# Patient Record
Sex: Male | Born: 1945 | Race: White | Hispanic: No | Marital: Married
Health system: Southern US, Community
[De-identification: ages and names within clinical notes are randomized; demographics above are authoritative.]

## PROBLEM LIST (undated history)

## (undated) DIAGNOSIS — J431 Panlobular emphysema: Secondary | ICD-10-CM

## (undated) DIAGNOSIS — Z720 Tobacco use: Secondary | ICD-10-CM

## (undated) DIAGNOSIS — J449 Chronic obstructive pulmonary disease, unspecified: Secondary | ICD-10-CM

## (undated) DIAGNOSIS — J439 Emphysema, unspecified: Secondary | ICD-10-CM

## (undated) DIAGNOSIS — C801 Malignant (primary) neoplasm, unspecified: Secondary | ICD-10-CM

## (undated) DIAGNOSIS — G4733 Obstructive sleep apnea (adult) (pediatric): Secondary | ICD-10-CM

## (undated) DIAGNOSIS — I739 Peripheral vascular disease, unspecified: Secondary | ICD-10-CM

## (undated) DIAGNOSIS — Z85820 Personal history of malignant melanoma of skin: Secondary | ICD-10-CM

## (undated) HISTORY — DX: Chronic obstructive pulmonary disease, unspecified: J44.9

## (undated) HISTORY — DX: Personal history of malignant melanoma of skin: Z85.820

## (undated) HISTORY — DX: Panlobular emphysema: J43.1

## (undated) HISTORY — DX: Emphysema, unspecified: J43.9

## (undated) HISTORY — DX: Obstructive sleep apnea (adult) (pediatric): G47.33

## (undated) HISTORY — DX: Tobacco use: Z72.0

## (undated) HISTORY — PX: HERNIA REPAIR: SHX51

---

## 2008-04-15 HISTORY — PX: MELANOMA EXCISION: SHX5266

## 2015-02-05 DIAGNOSIS — J449 Chronic obstructive pulmonary disease, unspecified: Secondary | ICD-10-CM | POA: Diagnosis not present

## 2015-03-13 DIAGNOSIS — Z125 Encounter for screening for malignant neoplasm of prostate: Secondary | ICD-10-CM | POA: Diagnosis not present

## 2015-03-13 DIAGNOSIS — Z79899 Other long term (current) drug therapy: Secondary | ICD-10-CM | POA: Diagnosis not present

## 2015-04-07 DIAGNOSIS — Z1212 Encounter for screening for malignant neoplasm of rectum: Secondary | ICD-10-CM | POA: Diagnosis not present

## 2015-04-18 DIAGNOSIS — L918 Other hypertrophic disorders of the skin: Secondary | ICD-10-CM | POA: Diagnosis not present

## 2015-04-18 DIAGNOSIS — L821 Other seborrheic keratosis: Secondary | ICD-10-CM | POA: Diagnosis not present

## 2015-04-18 DIAGNOSIS — D485 Neoplasm of uncertain behavior of skin: Secondary | ICD-10-CM | POA: Diagnosis not present

## 2016-03-14 DIAGNOSIS — N400 Enlarged prostate without lower urinary tract symptoms: Secondary | ICD-10-CM | POA: Diagnosis not present

## 2016-03-14 DIAGNOSIS — J439 Emphysema, unspecified: Secondary | ICD-10-CM | POA: Diagnosis not present

## 2016-03-14 DIAGNOSIS — F17200 Nicotine dependence, unspecified, uncomplicated: Secondary | ICD-10-CM | POA: Diagnosis not present

## 2016-03-14 DIAGNOSIS — Z125 Encounter for screening for malignant neoplasm of prostate: Secondary | ICD-10-CM | POA: Diagnosis not present

## 2016-03-14 DIAGNOSIS — Z1389 Encounter for screening for other disorder: Secondary | ICD-10-CM | POA: Diagnosis not present

## 2016-03-14 DIAGNOSIS — Z0000 Encounter for general adult medical examination without abnormal findings: Secondary | ICD-10-CM | POA: Diagnosis not present

## 2016-03-14 DIAGNOSIS — Z9181 History of falling: Secondary | ICD-10-CM | POA: Diagnosis not present

## 2016-03-14 DIAGNOSIS — D034 Melanoma in situ of scalp and neck: Secondary | ICD-10-CM | POA: Diagnosis not present

## 2016-03-14 DIAGNOSIS — Z79899 Other long term (current) drug therapy: Secondary | ICD-10-CM | POA: Diagnosis not present

## 2016-03-27 DIAGNOSIS — Z1212 Encounter for screening for malignant neoplasm of rectum: Secondary | ICD-10-CM | POA: Diagnosis not present

## 2016-04-18 ENCOUNTER — Other Ambulatory Visit: Payer: Self-pay | Admitting: Internal Medicine

## 2016-04-18 DIAGNOSIS — Z720 Tobacco use: Secondary | ICD-10-CM | POA: Insufficient documentation

## 2016-04-18 DIAGNOSIS — Z0000 Encounter for general adult medical examination without abnormal findings: Secondary | ICD-10-CM | POA: Diagnosis not present

## 2016-04-18 DIAGNOSIS — Z85820 Personal history of malignant melanoma of skin: Secondary | ICD-10-CM | POA: Insufficient documentation

## 2016-04-18 DIAGNOSIS — Z125 Encounter for screening for malignant neoplasm of prostate: Secondary | ICD-10-CM | POA: Diagnosis not present

## 2016-04-18 DIAGNOSIS — R0609 Other forms of dyspnea: Secondary | ICD-10-CM | POA: Diagnosis not present

## 2016-04-18 DIAGNOSIS — Z111 Encounter for screening for respiratory tuberculosis: Secondary | ICD-10-CM | POA: Diagnosis not present

## 2016-04-18 DIAGNOSIS — J431 Panlobular emphysema: Secondary | ICD-10-CM | POA: Insufficient documentation

## 2016-04-18 DIAGNOSIS — G4733 Obstructive sleep apnea (adult) (pediatric): Secondary | ICD-10-CM | POA: Insufficient documentation

## 2016-04-19 ENCOUNTER — Ambulatory Visit
Admission: RE | Admit: 2016-04-19 | Discharge: 2016-04-19 | Disposition: A | Discharge status: Home / Self Care | Payer: Medicare HMO | Source: Ambulatory Visit | Attending: Internal Medicine | Admitting: Internal Medicine

## 2016-04-19 DIAGNOSIS — J439 Emphysema, unspecified: Secondary | ICD-10-CM | POA: Insufficient documentation

## 2016-04-19 DIAGNOSIS — R0609 Other forms of dyspnea: Secondary | ICD-10-CM | POA: Insufficient documentation

## 2016-04-19 HISTORY — DX: Malignant (primary) neoplasm, unspecified: C80.1

## 2016-04-19 LAB — POCT I-STAT CREATININE: Creatinine, Ser: 0.8 mg/dL (ref 0.61–1.24)

## 2016-04-19 MED ORDER — IOPAMIDOL (ISOVUE-300) INJECTION 61%
75.0000 mL | Freq: Once | INTRAVENOUS | Status: AC | PRN
  Administered 2016-04-19: 75 mL via INTRAVENOUS

## 2016-04-22 ENCOUNTER — Encounter: Payer: Self-pay | Admitting: Cardiothoracic Surgery

## 2016-04-26 ENCOUNTER — Telehealth: Payer: Self-pay

## 2016-04-26 ENCOUNTER — Ambulatory Visit: Payer: Self-pay | Admitting: Cardiothoracic Surgery

## 2016-04-26 ENCOUNTER — Encounter: Payer: Self-pay | Admitting: Cardiothoracic Surgery

## 2016-04-26 ENCOUNTER — Ambulatory Visit (INDEPENDENT_AMBULATORY_CARE_PROVIDER_SITE_OTHER): Payer: Medicare HMO | Admitting: Cardiothoracic Surgery

## 2016-04-26 VITALS — BP 164/81 | HR 91 | Temp 98.4°F | Ht 73.0 in | Wt 155.0 lb

## 2016-04-26 DIAGNOSIS — R918 Other nonspecific abnormal finding of lung field: Secondary | ICD-10-CM

## 2016-04-26 NOTE — Telephone Encounter (Signed)
Patient's wife called in and states she is returning your phone call.

## 2016-04-26 NOTE — Progress Notes (Signed)
Patient ID: Derek Henderson, male   DOB: [birth year], 71 y.o.   MRN: [id number]  Chief Complaint  Patient presents with  . Other    Lung Mass    Referred By Dr. Emily Filbert Reason for Referral bilateral lower lobe infiltrates  HPI Location, Quality, Duration, Severity, Timing, Context, Modifying Factors, Associated Signs and Symptoms.  Derek Henderson is a 71 y.o. male.  He is a lifelong smoker having started at the age of 54. He smoked anywhere between 1-2 packs cigarettes a day. About a month ago he began experiencing a cough without any further symptoms. He denied any fevers or chills. He denied any chest pain. He denied any hemoptysis. He was seen by his primary care physician where chest x-ray was made which was abnormal and this prompted a CT scan the chest. CT scan the chest confirmed the presence of severe COPD with bilateral paramedian pulmonic infiltrates. There was no associated mediastinal adenopathy and the upper abdomen that was visualized was unremarkable. The patient was treated with antibiotics and prednisone and he states that he feels dramatically better. He specifically states that his cough is now almost completely gone. He has lost weight over the last several years but none within the last several months. He does continue to smoke at least 1-2 packs cigarettes per day.   Past Medical History:  Diagnosis Date  . Cancer ([place])   . COPD (chronic obstructive pulmonary disease) ([place])   . History of malignant melanoma   . Mild obstructive sleep apnea    can not afford CPAP machine  . Panlobular emphysema ([place])   . Tobacco abuse     Past Surgical History:  Procedure Laterality Date  . HERNIA REPAIR Left   . MELANOMA EXCISION Right 2010    Family History  Problem Relation Age of Onset  . Cancer Mother     lung and breast  . Tuberculosis Mother   . Cancer Father     throat  . Cancer Sister     stomach  . Cancer Brother     Brain   . Cancer Sister     lung   . Cancer Brother     lung    Social History Social History  Substance Use Topics  . Smoking status: Current Every Day Smoker    Packs/day: 1.00    Years: 45.00  . Smokeless tobacco: Never Used  . Alcohol use No    Allergies  Allergen Reactions  . Codeine Other (See Comments)    Current Outpatient Prescriptions  Medication Sig Dispense Refill  . cefUROXime (CEFTIN) 500 MG tablet Take 500 mg by mouth 2 (two) times daily with a meal.    . predniSONE (DELTASONE) 20 MG tablet Take 20 mg by mouth daily with breakfast.     No current facility-administered medications for this visit.       Review of Systems A complete review of systems was asked and was negative except for the following positive findingsWeight loss, cough, shortness of breath  Blood pressure (!) 164/81, pulse 91, temperature 98.4 F (36.9 C), temperature source Oral, height 6\' 1"  (1.854 m), weight 155 lb (70.3 kg).  Physical Exam CONSTITUTIONAL:  Pleasant, well-developed, well-nourished, and in no acute distress. EYES: Pupils equal and reactive to light, Sclera non-icteric EARS, NOSE, MOUTH AND THROAT:  The oropharynx was clear.  Dentition is good repair.  Oral mucosa pink and moist. LYMPH NODES:  Lymph nodes in the neck and axillae were normal RESPIRATORY:  Lungs were clear.  Normal respiratory effort without pathologic use of accessory muscles of respiration CARDIOVASCULAR: Heart was regular without murmurs.  There were no carotid bruits. GI: The abdomen was soft, nontender, and nondistended. There were no palpable masses. There was no hepatosplenomegaly. There were normal bowel sounds in all quadrants. GU:  Rectal deferred.   MUSCULOSKELETAL:  Normal muscle strength and tone.  No clubbing or cyanosis.   SKIN:  There were no pathologic skin lesions.  There were no nodules on palpation. NEUROLOGIC:  Sensation is normal.  Cranial nerves are grossly intact. PSYCH:  Oriented to person, place and time.  Mood and  affect are normal.  Data Reviewed CT scan and chest x-ray  I have personally reviewed the patient's imaging, laboratory findings and medical records.    Assessment    I have independently reviewed the CT scan. There are bilateral paramedial lung infiltrates that are probably most consistent with an inflammatory or infectious etiology.    Plan    I did recommend to him that he continue his antibiotics and prednisone. We'll bring him back again in 5 weeks' time for repeat CT of the chest. This will be 6 weeks since his original diagnostic study.       Nestor Lewandowsky, MD 04/26/2016, 3:13 PM

## 2016-04-26 NOTE — Patient Instructions (Signed)
We will call you with your CT Scan appointment and follow up with Dr. Genevive Bi.

## 2016-05-03 NOTE — Telephone Encounter (Signed)
I have called patient back and advised them that per Dr Genevive Bi' notes, he does want the patient to have a repeat CT scan done in 5 weeks. Patient understands.

## 2016-05-16 DIAGNOSIS — J431 Panlobular emphysema: Secondary | ICD-10-CM | POA: Diagnosis not present

## 2016-05-24 ENCOUNTER — Encounter: Payer: Self-pay | Admitting: Cardiothoracic Surgery

## 2016-05-24 ENCOUNTER — Ambulatory Visit
Admission: RE | Admit: 2016-05-24 | Discharge: 2016-05-24 | Disposition: A | Discharge status: Home / Self Care | Payer: Medicare HMO | Source: Ambulatory Visit | Attending: Cardiothoracic Surgery | Admitting: Cardiothoracic Surgery

## 2016-05-24 ENCOUNTER — Ambulatory Visit (INDEPENDENT_AMBULATORY_CARE_PROVIDER_SITE_OTHER): Payer: Medicare HMO | Admitting: Cardiothoracic Surgery

## 2016-05-24 VITALS — BP 132/73 | HR 55 | Temp 98.7°F | Wt 165.0 lb

## 2016-05-24 DIAGNOSIS — R918 Other nonspecific abnormal finding of lung field: Secondary | ICD-10-CM

## 2016-05-24 DIAGNOSIS — I700 Atherosclerosis of aorta: Secondary | ICD-10-CM | POA: Insufficient documentation

## 2016-05-24 DIAGNOSIS — I2510 Atherosclerotic heart disease of native coronary artery without angina pectoris: Secondary | ICD-10-CM | POA: Diagnosis not present

## 2016-05-24 DIAGNOSIS — J432 Centrilobular emphysema: Secondary | ICD-10-CM | POA: Diagnosis not present

## 2016-05-24 NOTE — Patient Instructions (Signed)
I will contact you in 3 months with a CT Scan appointment and follow up with Dr. Genevive Bi.

## 2016-05-24 NOTE — Progress Notes (Signed)
  Patient ID: Derek Henderson, male   DOB: 1945-04-18, 71 y.o.   MRN: [id number]  HISTORY: This patient returns today in follow-up. He continues to smoke at least a pack of cigarettes per day. He states that he has a chronic cough which is nonproductive. He has had no fevers or chills. He does not complain of any shortness of breath and instead he is able to perform all his activities of daily living.   Vitals:   05/24/16 1243  BP: 132/73  Pulse: (!) 55  Temp: 98.7 F (37.1 C)     EXAM:    Resp: Lungs are clear bilaterally But distant .  No respiratory distress, normal effort. Heart:  Regular without murmurs Abd:  Abdomen is soft, non distended and non tender. No masses are palpable.  There is no rebound and no guarding.  Neurological: Alert and oriented to person, place, and time. Coordination normal.  Skin: Skin is warm and dry. No rash noted. No diaphoretic. No erythema. No pallor.  Psychiatric: Normal mood and affect. Normal behavior. Judgment and thought content normal.    ASSESSMENT: I have independently reviewed the patient's CT scans. The bibasilar infiltrates have improved. The dominant right upper lobe nodule unfortunately has gotten somewhat larger. We'll bring the patient back again in 3 months for repeat CT scan the chest for further follow-up.   PLAN:   Follow-up CT scan in 3 months.    Nestor Lewandowsky, MD

## 2016-08-13 ENCOUNTER — Telehealth: Payer: Self-pay

## 2016-08-13 DIAGNOSIS — R918 Other nonspecific abnormal finding of lung field: Secondary | ICD-10-CM

## 2016-08-13 NOTE — Telephone Encounter (Signed)
-----   Message from [place], [place] sent at 05/24/2016  1:14 PM EST ----- Regarding: Appointments Call patient and schedule a CT Scan w/o contrast and then a follow up appointment with Dr. Genevive Bi.

## 2016-08-13 NOTE — Telephone Encounter (Signed)
Called patient to remind him that he needs to have a recheck on his CT Scan and then a follow up appointment with Dr. Genevive Bi. I also told him that I would mail him the information so he could have it on paper. Patient understood and had no further questions.

## 2016-08-23 ENCOUNTER — Telehealth: Payer: Self-pay

## 2016-08-23 NOTE — Telephone Encounter (Signed)
Received fax for request of clinical notes from [facility]. Katina Degree completed and clinical notes faxed to [phone] at this time with confirmation.

## 2016-08-30 ENCOUNTER — Ambulatory Visit
Admission: RE | Admit: 2016-08-30 | Discharge: 2016-08-30 | Disposition: A | Discharge status: Home / Self Care | Payer: Medicare HMO | Source: Ambulatory Visit | Attending: Cardiothoracic Surgery | Admitting: Cardiothoracic Surgery

## 2016-08-30 ENCOUNTER — Ambulatory Visit: Admission: RE | Admit: 2016-08-30 | Payer: Medicare HMO | Source: Ambulatory Visit

## 2016-08-30 ENCOUNTER — Encounter: Payer: Self-pay | Admitting: Cardiothoracic Surgery

## 2016-08-30 ENCOUNTER — Ambulatory Visit (INDEPENDENT_AMBULATORY_CARE_PROVIDER_SITE_OTHER): Payer: Medicare HMO | Admitting: Cardiothoracic Surgery

## 2016-08-30 VITALS — BP 151/76 | HR 55 | Temp 98.1°F | Resp 18 | Ht 73.0 in | Wt 168.2 lb

## 2016-08-30 DIAGNOSIS — I2510 Atherosclerotic heart disease of native coronary artery without angina pectoris: Secondary | ICD-10-CM | POA: Insufficient documentation

## 2016-08-30 DIAGNOSIS — R918 Other nonspecific abnormal finding of lung field: Secondary | ICD-10-CM

## 2016-08-30 DIAGNOSIS — R911 Solitary pulmonary nodule: Secondary | ICD-10-CM

## 2016-08-30 DIAGNOSIS — J432 Centrilobular emphysema: Secondary | ICD-10-CM | POA: Insufficient documentation

## 2016-08-30 DIAGNOSIS — I700 Atherosclerosis of aorta: Secondary | ICD-10-CM | POA: Diagnosis not present

## 2016-08-30 NOTE — Patient Instructions (Signed)
We will call you in a year to schedule your CT Scan and follow up appointment with Dr. Genevive Bi.  If you are wanting to quit smoking, we could set you up with the nurse navigator.

## 2016-08-30 NOTE — Progress Notes (Signed)
  Patient ID: Derek Henderson, male   DOB: 1945/12/14, 71 y.o.   MRN: [id number]  HISTORY: He returns today in follow-up. He does continue to smoke at least a half pack cigarettes per day. He does complain of some shortness of breath with exertion. He has a cough which is nonproductive.   Vitals:   08/30/16 0851  BP: (!) 151/76  Pulse: (!) 55  Resp: 18  Temp: 98.1 F (36.7 C)     EXAM:    Resp: Lungs are clear bilaterally.  No respiratory distress, normal effort. Heart:  Regular without murmurs Abd:  Abdomen is soft, non distended and non tender. No masses are palpable.  There is no rebound and no guarding.  Neurological: Alert and oriented to person, place, and time. Coordination normal.  Skin: Skin is warm and dry. No rash noted. No diaphoretic. No erythema. No pallor.  Psychiatric: Normal mood and affect. Normal behavior. Judgment and thought content normal.    ASSESSMENT: He did have a chest CT scan made today. Of independently reviewed that. I compared it to his prior scans. All the nodules appear either resolved or improving. Therefore I suspect that these are inflammatory or infectious in etiology.   PLAN:   We would like to bring him back again in 12 months time with a repeat chest CT. We again encouraged him to try to stop smoking. I offered him our smoking cessation classes but he declined. I will see him back again in 12 months with a noncontrast chest CT.    Nestor Lewandowsky, MD

## 2016-11-20 DIAGNOSIS — Z79899 Other long term (current) drug therapy: Secondary | ICD-10-CM | POA: Diagnosis not present

## 2016-11-20 DIAGNOSIS — Z125 Encounter for screening for malignant neoplasm of prostate: Secondary | ICD-10-CM | POA: Diagnosis not present

## 2016-11-20 DIAGNOSIS — J431 Panlobular emphysema: Secondary | ICD-10-CM | POA: Diagnosis not present

## 2016-11-20 DIAGNOSIS — R918 Other nonspecific abnormal finding of lung field: Secondary | ICD-10-CM | POA: Diagnosis not present

## 2016-11-20 DIAGNOSIS — Z0000 Encounter for general adult medical examination without abnormal findings: Secondary | ICD-10-CM | POA: Diagnosis not present

## 2017-02-04 DIAGNOSIS — I998 Other disorder of circulatory system: Secondary | ICD-10-CM | POA: Diagnosis not present

## 2017-02-05 ENCOUNTER — Other Ambulatory Visit (INDEPENDENT_AMBULATORY_CARE_PROVIDER_SITE_OTHER): Payer: Medicare HMO

## 2017-02-05 ENCOUNTER — Other Ambulatory Visit (INDEPENDENT_AMBULATORY_CARE_PROVIDER_SITE_OTHER): Payer: Self-pay | Admitting: Vascular Surgery

## 2017-02-05 ENCOUNTER — Encounter (INDEPENDENT_AMBULATORY_CARE_PROVIDER_SITE_OTHER): Payer: Self-pay | Admitting: Vascular Surgery

## 2017-02-05 ENCOUNTER — Ambulatory Visit (INDEPENDENT_AMBULATORY_CARE_PROVIDER_SITE_OTHER): Payer: Medicare HMO | Admitting: Vascular Surgery

## 2017-02-05 VITALS — BP 155/76 | HR 61 | Resp 16 | Ht 73.0 in | Wt 161.0 lb

## 2017-02-05 DIAGNOSIS — R0989 Other specified symptoms and signs involving the circulatory and respiratory systems: Secondary | ICD-10-CM

## 2017-02-05 DIAGNOSIS — Z720 Tobacco use: Secondary | ICD-10-CM | POA: Diagnosis not present

## 2017-02-05 DIAGNOSIS — I998 Other disorder of circulatory system: Secondary | ICD-10-CM | POA: Diagnosis not present

## 2017-02-05 DIAGNOSIS — I739 Peripheral vascular disease, unspecified: Secondary | ICD-10-CM | POA: Diagnosis not present

## 2017-02-05 NOTE — Progress Notes (Signed)
Subjective:    Patient ID: Derek Henderson, male    DOB: Apr 17, 1945, 71 y.o.   MRN: [id number] Chief Complaint  Patient presents with  . Follow-up    ischemic toe decrease pedal pulses   Presents as a new patient referred by Dr. Sabra Heck for evaluation of peripheral artery disease. During a recent physical examination, Dr. Sabra Heck was unable to palpate pedal pulses to the left lower extremity. The patient is endorses a long-standing history of progressively worsening discomfort to the left lower extremity. He has been experiencing what sounds like calf claudication for months. Over the last week, the patient has started to experience left foot pain. Is discomfort is worsened with activity. He denies any rest pain or ulcer to the lower extremity. The patient underwent a bilateral ABI which was notable for triphasic tibials and no evidence of significant right lower extremity arterial disease. Monophasic left tibials with severe left lower extremity arterial disease. Normal toe brachial indices on the right with abnormal on the left. There is no previous to compare to. Patient denies any fever, nausea or vomiting.   Review of Systems  Constitutional: Negative.   HENT: Negative.   Eyes: Negative.   Respiratory: Negative.   Cardiovascular:       Left lower extremity pain  Gastrointestinal: Negative.   Endocrine: Negative.   Genitourinary: Negative.   Musculoskeletal: Negative.   Skin: Negative.   Allergic/Immunologic: Negative.   Neurological: Negative.   Hematological: Negative.   Psychiatric/Behavioral: Negative.       Objective:   Physical Exam  Constitutional: He is oriented to person, place, and time. He appears well-developed and well-nourished. No distress.  HENT:  Head: Normocephalic and atraumatic.  Eyes: Pupils are equal, round, and reactive to light. Conjunctivae are normal.  Neck: Normal range of motion.  Cardiovascular: Normal rate, regular rhythm, normal heart sounds  and intact distal pulses.   Pulses:      Radial pulses are 2+ on the right side, and 2+ on the left side.       Dorsalis pedis pulses are 2+ on the right side.       Posterior tibial pulses are 2+ on the right side.  Unable to palpate left lower extremity pedal pulses.  Pulmonary/Chest: Effort normal.  Musculoskeletal: Normal range of motion. He exhibits no edema.  Neurological: He is alert and oriented to person, place, and time.  Skin: Skin is warm and dry. He is not diaphoretic.  Skin is intact  Psychiatric: He has a normal mood and affect. His behavior is normal. Judgment and thought content normal.   BP (!) 155/76 (BP Location: Left Arm)   Pulse 61   Resp 16   Ht 6\' 1"  (1.854 m)   Wt 161 lb (73 kg)   BMI 21.24 kg/m   Past Medical History:  Diagnosis Date  . Cancer ([place])    melanoma  . COPD (chronic obstructive pulmonary disease) ([place])   . Emphysema lung ([place])   . History of malignant melanoma   . Mild obstructive sleep apnea    can not afford CPAP machine  . Panlobular emphysema ([place])   . Tobacco abuse    Social History   Social History  . Marital status: Single    Spouse name: N/A  . Number of children: N/A  . Years of education: N/A   Occupational History  . Not on file.   Social History Main Topics  . Smoking status: Current Every Day Smoker  Packs/day: 1.00    Years: 45.00  . Smokeless tobacco: Current User    Types: Chew  . Alcohol use No  . Drug use: No  . Sexual activity: Not on file   Other Topics Concern  . Not on file   Social History Narrative  . No narrative on file   Past Surgical History:  Procedure Laterality Date  . HERNIA REPAIR Left   . MELANOMA EXCISION Right 2010   Family History  Problem Relation Age of Onset  . Cancer Mother        lung and breast  . Tuberculosis Mother   . Cancer Father        throat  . Cancer Sister        stomach  . Cancer Brother        Brain   . Cancer Sister        lung  . Cancer Brother         lung   Allergies  Allergen Reactions  . Codeine Other (See Comments)    Headache       Assessment & Plan:  Presents as a new patient referred by Dr. Sabra Heck for evaluation of peripheral artery disease. During a recent physical examination, Dr. Sabra Heck was unable to palpate pedal pulses to the left lower extremity. The patient is endorses a long-standing history of progressively worsening discomfort to the left lower extremity. He has been experiencing what sounds like calf claudication for months. Over the last week, the patient has started to experience left foot pain. Is discomfort is worsened with activity. He denies any rest pain or ulcer to the lower extremity. The patient underwent a bilateral ABI which was notable for triphasic tibials and no evidence of significant right lower extremity arterial disease. Monophasic left tibials with severe left lower extremity arterial disease. Normal toe brachial indices on the right with abnormal on the left. There is no previous to compare to. Patient denies any fever, nausea or vomiting.  1. Peripheral artery disease ([place]) - New Patient with progressively worsening pain to the left lower extremity. Unable to palpate pedal pulses to the left lower extremity on exam ABI with severe left lower extremity arterial occlusive disease. Recommend left lower extremity angiogram with possible intervention. Procedure, risks and benefits explained to the patient All questions answered Patient wishes to proceed  2. Tobacco abuse - Stable We had a discussion for approximately 10 minutes regarding the absolute need for smoking cessation due to the deleterious nature of tobacco on the vascular system. We discussed the tobacco use would diminish patency of any intervention, and likely significantly worsen progressio of disease. We discussed multiple agents for quitting including replacement therapy or medications to reduce cravings such as Chantix. The patient  voices their understanding of the importance of smoking cessation.  Current Outpatient Prescriptions on File Prior to Visit  Medication Sig Dispense Refill  . budesonide-formoterol (SYMBICORT) 160-4.5 MCG/ACT inhaler Inhale 2 puffs into the lungs 2 (two) times daily.     No current facility-administered medications on file prior to visit.    There are no Patient Instructions on file for this visit. No Follow-up on file.  Emberlyn Burlison A Arnelle Nale, PA-C

## 2017-02-07 ENCOUNTER — Other Ambulatory Visit (INDEPENDENT_AMBULATORY_CARE_PROVIDER_SITE_OTHER): Payer: Self-pay | Admitting: Vascular Surgery

## 2017-02-10 ENCOUNTER — Encounter
Admission: RE | Admit: 2017-02-10 | Discharge: 2017-02-10 | Disposition: A | Discharge status: Home / Self Care | Payer: Medicare HMO | Source: Ambulatory Visit | Attending: Vascular Surgery | Admitting: Vascular Surgery

## 2017-02-10 DIAGNOSIS — Z9889 Other specified postprocedural states: Secondary | ICD-10-CM | POA: Diagnosis not present

## 2017-02-10 DIAGNOSIS — Z808 Family history of malignant neoplasm of other organs or systems: Secondary | ICD-10-CM | POA: Diagnosis not present

## 2017-02-10 DIAGNOSIS — Z885 Allergy status to narcotic agent status: Secondary | ICD-10-CM | POA: Diagnosis not present

## 2017-02-10 DIAGNOSIS — Z85820 Personal history of malignant melanoma of skin: Secondary | ICD-10-CM | POA: Diagnosis not present

## 2017-02-10 DIAGNOSIS — Z803 Family history of malignant neoplasm of breast: Secondary | ICD-10-CM | POA: Diagnosis not present

## 2017-02-10 DIAGNOSIS — Z800 Family history of malignant neoplasm of digestive organs: Secondary | ICD-10-CM | POA: Diagnosis not present

## 2017-02-10 DIAGNOSIS — J449 Chronic obstructive pulmonary disease, unspecified: Secondary | ICD-10-CM | POA: Diagnosis not present

## 2017-02-10 DIAGNOSIS — F17210 Nicotine dependence, cigarettes, uncomplicated: Secondary | ICD-10-CM | POA: Diagnosis not present

## 2017-02-10 DIAGNOSIS — L97509 Non-pressure chronic ulcer of other part of unspecified foot with unspecified severity: Secondary | ICD-10-CM | POA: Diagnosis not present

## 2017-02-10 DIAGNOSIS — G4733 Obstructive sleep apnea (adult) (pediatric): Secondary | ICD-10-CM | POA: Diagnosis not present

## 2017-02-10 DIAGNOSIS — I7025 Atherosclerosis of native arteries of other extremities with ulceration: Secondary | ICD-10-CM | POA: Diagnosis not present

## 2017-02-10 DIAGNOSIS — Z801 Family history of malignant neoplasm of trachea, bronchus and lung: Secondary | ICD-10-CM | POA: Diagnosis not present

## 2017-02-10 HISTORY — DX: Peripheral vascular disease, unspecified: I73.9

## 2017-02-10 LAB — CREATININE, SERUM
Creatinine, Ser: 0.96 mg/dL (ref 0.61–1.24)
GFR calc Af Amer: >60 mL/min (ref >60)
GFR calc non Af Amer: >60 mL/min (ref >60)

## 2017-02-10 LAB — BUN: BUN: 13 mg/dL (ref 6–20)

## 2017-02-10 MED ORDER — CEFAZOLIN SODIUM-DEXTROSE 2-4 GM/100ML-% IV SOLN
2.0000 g | Freq: Once | INTRAVENOUS | Status: AC
  Administered 2017-02-11: 2 g via INTRAVENOUS

## 2017-02-11 ENCOUNTER — Encounter: Payer: Self-pay | Admitting: *Deleted

## 2017-02-11 ENCOUNTER — Encounter
Admission: RE | Disposition: A | Discharge status: Home / Self Care | Payer: Self-pay | Source: Ambulatory Visit | Attending: Vascular Surgery

## 2017-02-11 ENCOUNTER — Ambulatory Visit
Admission: RE | Admit: 2017-02-11 | Discharge: 2017-02-11 | Disposition: A | Discharge status: Home / Self Care | Payer: Medicare HMO | Source: Ambulatory Visit | Attending: Vascular Surgery | Admitting: Vascular Surgery

## 2017-02-11 DIAGNOSIS — G4733 Obstructive sleep apnea (adult) (pediatric): Secondary | ICD-10-CM | POA: Insufficient documentation

## 2017-02-11 DIAGNOSIS — J449 Chronic obstructive pulmonary disease, unspecified: Secondary | ICD-10-CM | POA: Insufficient documentation

## 2017-02-11 DIAGNOSIS — I70248 Atherosclerosis of native arteries of left leg with ulceration of other part of lower left leg: Secondary | ICD-10-CM | POA: Diagnosis not present

## 2017-02-11 DIAGNOSIS — I7025 Atherosclerosis of native arteries of other extremities with ulceration: Secondary | ICD-10-CM | POA: Diagnosis not present

## 2017-02-11 DIAGNOSIS — Z800 Family history of malignant neoplasm of digestive organs: Secondary | ICD-10-CM | POA: Insufficient documentation

## 2017-02-11 DIAGNOSIS — L97509 Non-pressure chronic ulcer of other part of unspecified foot with unspecified severity: Secondary | ICD-10-CM | POA: Diagnosis not present

## 2017-02-11 DIAGNOSIS — Z85820 Personal history of malignant melanoma of skin: Secondary | ICD-10-CM | POA: Diagnosis not present

## 2017-02-11 DIAGNOSIS — Z801 Family history of malignant neoplasm of trachea, bronchus and lung: Secondary | ICD-10-CM | POA: Diagnosis not present

## 2017-02-11 DIAGNOSIS — Z885 Allergy status to narcotic agent status: Secondary | ICD-10-CM | POA: Diagnosis not present

## 2017-02-11 DIAGNOSIS — Z9889 Other specified postprocedural states: Secondary | ICD-10-CM | POA: Diagnosis not present

## 2017-02-11 DIAGNOSIS — Z808 Family history of malignant neoplasm of other organs or systems: Secondary | ICD-10-CM | POA: Insufficient documentation

## 2017-02-11 DIAGNOSIS — Z803 Family history of malignant neoplasm of breast: Secondary | ICD-10-CM | POA: Insufficient documentation

## 2017-02-11 DIAGNOSIS — F17210 Nicotine dependence, cigarettes, uncomplicated: Secondary | ICD-10-CM | POA: Insufficient documentation

## 2017-02-11 HISTORY — PX: LOWER EXTREMITY INTERVENTION: CATH118252

## 2017-02-11 HISTORY — PX: LOWER EXTREMITY ANGIOGRAPHY: CATH118251

## 2017-02-11 SURGERY — LOWER EXTREMITY ANGIOGRAPHY
Anesthesia: Moderate Sedation | Laterality: Left

## 2017-02-11 MED ORDER — ONDANSETRON HCL 4 MG/2ML IJ SOLN: 4.0000 mg | Freq: Four times a day (QID) | INTRAMUSCULAR | Status: DC | PRN

## 2017-02-11 MED ORDER — SODIUM CHLORIDE 0.9% FLUSH: 3.0000 mL | INTRAVENOUS | Status: DC | PRN

## 2017-02-11 MED ORDER — MIDAZOLAM HCL 5 MG/5ML IJ SOLN
INTRAMUSCULAR | Status: AC
  Filled 2017-02-11: qty 5

## 2017-02-11 MED ORDER — LABETALOL HCL 5 MG/ML IV SOLN
10.0000 mg | INTRAVENOUS | Status: DC | PRN
  Administered 2017-02-11: 10 mg via INTRAVENOUS

## 2017-02-11 MED ORDER — OXYCODONE HCL 5 MG PO TABS: 5.0000 mg | ORAL_TABLET | ORAL | Status: DC | PRN

## 2017-02-11 MED ORDER — METHYLPREDNISOLONE SODIUM SUCC 125 MG IJ SOLR: 125.0000 mg | INTRAMUSCULAR | Status: DC | PRN

## 2017-02-11 MED ORDER — CLOPIDOGREL BISULFATE 75 MG PO TABS: 75.0000 mg | ORAL_TABLET | Freq: Every day | ORAL | 4 refills | Status: DC

## 2017-02-11 MED ORDER — ASPIRIN EC 81 MG PO TBEC: 81.0000 mg | DELAYED_RELEASE_TABLET | Freq: Every day | ORAL | 2 refills | Status: AC

## 2017-02-11 MED ORDER — LABETALOL HCL 5 MG/ML IV SOLN
INTRAVENOUS | Status: AC
  Filled 2017-02-11: qty 4

## 2017-02-11 MED ORDER — CLOPIDOGREL BISULFATE 75 MG PO TABS
ORAL_TABLET | ORAL | Status: AC
  Filled 2017-02-11: qty 4

## 2017-02-11 MED ORDER — LIDOCAINE HCL (PF) 1 % IJ SOLN
INTRAMUSCULAR | Status: AC
  Filled 2017-02-11: qty 30

## 2017-02-11 MED ORDER — HEPARIN SODIUM (PORCINE) 1000 UNIT/ML IJ SOLN
INTRAMUSCULAR | Status: DC | PRN
  Administered 2017-02-11: 5000 [IU] via INTRAVENOUS

## 2017-02-11 MED ORDER — CLOPIDOGREL BISULFATE 75 MG PO TABS
300.0000 mg | ORAL_TABLET | Freq: Once | ORAL | Status: AC
  Administered 2017-02-11: 300 mg via ORAL

## 2017-02-11 MED ORDER — FENTANYL CITRATE (PF) 100 MCG/2ML IJ SOLN
INTRAMUSCULAR | Status: AC
Start: 2017-02-11 — End: ?
  Filled 2017-02-11: qty 2

## 2017-02-11 MED ORDER — SODIUM CHLORIDE 0.9 % IV SOLN
INTRAVENOUS | Status: DC
  Administered 2017-02-11: 07:00:00 via INTRAVENOUS

## 2017-02-11 MED ORDER — HYDRALAZINE HCL 20 MG/ML IJ SOLN
INTRAMUSCULAR | Status: AC
  Filled 2017-02-11: qty 1

## 2017-02-11 MED ORDER — MORPHINE SULFATE (PF) 4 MG/ML IV SOLN: 2.0000 mg | INTRAVENOUS | Status: DC | PRN

## 2017-02-11 MED ORDER — IOPAMIDOL (ISOVUE-300) INJECTION 61%
INTRAVENOUS | Status: DC | PRN
  Administered 2017-02-11: 85 mL via INTRAVENOUS

## 2017-02-11 MED ORDER — CLOPIDOGREL BISULFATE 75 MG PO TABS: 75.0000 mg | ORAL_TABLET | Freq: Every day | ORAL | Status: DC

## 2017-02-11 MED ORDER — ASPIRIN 325 MG PO TABS: 325.0000 mg | ORAL_TABLET | Freq: Every day | ORAL | Status: DC

## 2017-02-11 MED ORDER — ASPIRIN 325 MG PO TABS: 325.0000 mg | ORAL_TABLET | Freq: Once | ORAL | Status: DC

## 2017-02-11 MED ORDER — MIDAZOLAM HCL 2 MG/2ML IJ SOLN
INTRAMUSCULAR | Status: DC | PRN
  Administered 2017-02-11: 2 mg via INTRAVENOUS
  Administered 2017-02-11: 1 mg via INTRAVENOUS

## 2017-02-11 MED ORDER — FENTANYL CITRATE (PF) 100 MCG/2ML IJ SOLN
INTRAMUSCULAR | Status: DC | PRN
  Administered 2017-02-11: 25 ug via INTRAVENOUS
  Administered 2017-02-11: 50 ug via INTRAVENOUS

## 2017-02-11 MED ORDER — HYDRALAZINE HCL 20 MG/ML IJ SOLN
5.0000 mg | INTRAMUSCULAR | Status: AC | PRN
  Administered 2017-02-11 (×2): 5 mg via INTRAVENOUS

## 2017-02-11 MED ORDER — SODIUM CHLORIDE 0.9% FLUSH: 3.0000 mL | Freq: Two times a day (BID) | INTRAVENOUS | Status: DC

## 2017-02-11 MED ORDER — ASPIRIN EC 325 MG PO TBEC
DELAYED_RELEASE_TABLET | ORAL | Status: AC
Start: 2017-02-11 — End: 2017-02-11
  Administered 2017-02-11: 325 mg
  Filled 2017-02-11: qty 1

## 2017-02-11 MED ORDER — FAMOTIDINE 20 MG PO TABS: 40.0000 mg | ORAL_TABLET | ORAL | Status: DC | PRN

## 2017-02-11 MED ORDER — HYDROMORPHONE HCL 1 MG/ML IJ SOLN: 1.0000 mg | Freq: Once | INTRAMUSCULAR | Status: DC | PRN

## 2017-02-11 MED ORDER — HEPARIN (PORCINE) IN NACL 2-0.9 UNIT/ML-% IJ SOLN
INTRAMUSCULAR | Status: AC
  Filled 2017-02-11: qty 1000

## 2017-02-11 MED ORDER — HEPARIN SODIUM (PORCINE) 1000 UNIT/ML IJ SOLN
INTRAMUSCULAR | Status: AC
  Filled 2017-02-11: qty 1

## 2017-02-11 MED ORDER — SODIUM CHLORIDE 0.9 % IV SOLN: INTRAVENOUS | Status: DC

## 2017-02-11 MED ORDER — SODIUM CHLORIDE 0.9 % IV SOLN: 250.0000 mL | INTRAVENOUS | Status: DC | PRN

## 2017-02-11 SURGICAL SUPPLY — 22 items
BALLN LUTONIX 5X150X130 (BALLOONS) ×4
BALLN LUTONIX DCB 6X100X130 (BALLOONS) ×4
BALLOON LUTONIX 5X150X130 (BALLOONS) ×2 IMPLANT
BALLOON LUTONIX DCB 6X100X130 (BALLOONS) ×2 IMPLANT
CATH CROSSER 14S OTW 146CM (CATHETERS) ×4 IMPLANT
CATH PIG 70CM (CATHETERS) ×4 IMPLANT
CATH SIDEKICK XL ST 110CM (SHEATH) ×4 IMPLANT
CATH VERT 5FR 125CM (CATHETERS) ×4 IMPLANT
DEVICE PRESTO INFLATION (MISCELLANEOUS) ×4 IMPLANT
DEVICE STARCLOSE SE CLOSURE (Vascular Products) ×4 IMPLANT
GUIDEWIRE LT ZIPWIRE 035X260 (WIRE) ×4 IMPLANT
KIT FLOWMATE PROCEDURAL (MISCELLANEOUS) ×4 IMPLANT
NEEDLE ENTRY 21GA 7CM ECHOTIP (NEEDLE) ×4 IMPLANT
PACK ANGIOGRAPHY (CUSTOM PROCEDURE TRAY) ×4 IMPLANT
SET INTRO CAPELLA COAXIAL (SET/KITS/TRAYS/PACK) ×4 IMPLANT
SHEATH BRITE TIP 5FRX11 (SHEATH) ×4 IMPLANT
SHEATH FLEXOR ANSEL2 7FRX45 (SHEATH) ×4 IMPLANT
STENT TIGRIS 6X100X120 (Permanent Stent) ×4 IMPLANT
SYR MEDRAD MARK V 150ML (SYRINGE) ×4 IMPLANT
TUBING CONTRAST HIGH PRESS 72 (TUBING) ×4 IMPLANT
WIRE HI TORQ VERSACORE 300 (WIRE) ×4 IMPLANT
WIRE J 3MM .035X145CM (WIRE) ×4 IMPLANT

## 2017-02-11 NOTE — Op Note (Signed)
[facility] Percutaneous Study/Intervention Procedural Note   Date of Surgery: 02/11/2017  Surgeon:  Katha Cabal, MD.  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 Introduction catheter into left lower extremity 3rd order catheter placement  2. Contrast injection left lower extremity for distal runoff   3. Crosser atherectomy of the left SFA and popliteal arteries 4.  Percutaneous transluminal angioplasty and stent placement with a Tigers 6 x 100 Z down okay we will get left superficial femoral artery and popliteal             5.  Star close closure right common femoral arteriotomy  Anesthesia: Conscious sedation was administered by the radiology RN under my direct supervision. IV Versed plus fentanyl were utilized. Continuous ECG, pulse oximetry and blood pressure was monitored throughout the entire procedure. Conscious sedation was for a total of 59 minutes.  Sheath: 7 [place] Ansel right common femoral  Contrast: 85 cc  Fluoroscopy Time: 11.1 minutes  Indications: [name] presents with atherosclerotic occlusive disease bilateral lower extremities with right great toe pain and early skin breakdown the risks and benefits are reviewed all questions answered patient agrees to proceed.  Procedure: MYRLE DUES is a 71 y.o. y.o. male who was identified and appropriate procedural time out was performed. The patient was then placed supine on the table and prepped and draped in the usual sterile fashion.   Ultrasound was placed in the sterile sleeve and the right groin was evaluated the right common femoral artery was echolucent and pulsatile indicating patency.  Image was recorded for the permanent record and under real-time visualization a microneedle was  inserted into the common femoral artery microwire followed by a micro-sheath.  A J-wire was then advanced through the micro-sheath and a  5 [place] sheath was then inserted over a J-wire. J-wire was then advanced and a 5 French pigtail catheter was positioned at the level of T12. AP projection of the aorta was then obtained. Pigtail catheter was repositioned to above the bifurcation and a RAO view of the pelvis was obtained.  Subsequently a pigtail catheter with the stiff angle Glidewire was used to cross the aortic bifurcation the catheter wire were advanced down into the left distal external iliac artery. Oblique view of the femoral bifurcation was then obtained and subsequently the wire was reintroduced and the pigtail catheter negotiated into the SFA representing third order catheter placement. Distal runoff was then performed.  5000 units of heparin was then given and allowed to circulate and a 7 [place] Ansel sheath was advanced up and over the bifurcation and positioned in the femoral artery  The 14 S Crosser catheter was then prepped on the field and a side kick catheter was advanced into the cul-de-sac of the SFA under magnified imaging in the LAO projection. Using the Crosser catheter the occlusion of the SFA and popliteal was negotiated.  Injection of contrast through the Crosser side port confirmed intraluminal positioning.  Side kick catheter and stiff angle Glidewire were then advanced down into the distal popliteal.  Distal runoff was then completed by hand injection through the catheter.s negotiated and hand injection of contrast was used to verify intraluminal placement distally.    A 5 mm x 100 mm Lutonix drug-eluting balloon was used to angioplasty the superficial femoral and popliteal arteries. Inflations were to 14 atmospheres for 2 minutes. Follow-up imaging demonstrated patency with  adequate preparation of the vessel for a drug-coated balloon.  Follow-up imaging demonstrated greater than  50% residual stenosis and therefore a 6 mm x 100 mm Tigers stent was deployed and subsequently postdilated with a 6 mm x 100 mm Lutonix drug-eluting balloon.  Distal runoff was then reassessed.  After review of these images the sheath is pulled into the right external iliac oblique of the common femoral is obtained and a Star close device deployed. There no immediate Complications.  Findings: The abdominal aorta is opacified with a bolus injection contrast. Renal arteries are widely patent. The aorta itself has diffuse disease but no hemodynamically significant lesions. The common and external iliac arteries are widely patent bilaterally.  The left common femoral is widely patent as is the profunda femoris.  The SFA does indeed have a significant stenosis which leads to an occlusion in its midportion down to Hunter's canal.  The distal popliteal demonstrates mild to moderate disease and the trifurcation is patent with a moderate stenosis at the origin of the anterior tibial artery.  The posterior tibial and peroneal demonstrates patency with the posterior tibial being widely patent down to the ankle and filling the pedal arch the anterior tibial occludes distally the peroneal is patent down to its bifurcation at the level of the ankle but quite small.  Angioplasty and stent placement of the SFA yields an excellent result with less than 10% residual stenosis.    Disposition: Patient was taken to the recovery room in stable condition having tolerated the procedure well.  Belenda Cruise Casady Voshell 02/11/2017,9:30 AM

## 2017-02-11 NOTE — H&P (Signed)
[facility] History & Physical Update  The patient was interviewed and re-examined.  The patient's previous History and Physical has been reviewed and is unchanged.  There is no change in the plan of care. We plan to proceed with the scheduled procedure.  Hortencia Pilar, MD  02/11/2017, 7:45 AM

## 2017-03-04 ENCOUNTER — Other Ambulatory Visit (INDEPENDENT_AMBULATORY_CARE_PROVIDER_SITE_OTHER): Payer: Self-pay | Admitting: Vascular Surgery

## 2017-03-04 DIAGNOSIS — I739 Peripheral vascular disease, unspecified: Secondary | ICD-10-CM

## 2017-03-10 ENCOUNTER — Ambulatory Visit (INDEPENDENT_AMBULATORY_CARE_PROVIDER_SITE_OTHER): Payer: Medicare HMO | Admitting: Vascular Surgery

## 2017-03-10 ENCOUNTER — Encounter (INDEPENDENT_AMBULATORY_CARE_PROVIDER_SITE_OTHER): Payer: Self-pay | Admitting: Vascular Surgery

## 2017-03-10 ENCOUNTER — Ambulatory Visit (INDEPENDENT_AMBULATORY_CARE_PROVIDER_SITE_OTHER): Payer: Medicare HMO

## 2017-03-10 VITALS — BP 124/73 | HR 70 | Resp 16 | Wt 159.0 lb

## 2017-03-10 DIAGNOSIS — I739 Peripheral vascular disease, unspecified: Secondary | ICD-10-CM | POA: Diagnosis not present

## 2017-03-10 DIAGNOSIS — J431 Panlobular emphysema: Secondary | ICD-10-CM | POA: Diagnosis not present

## 2017-03-10 DIAGNOSIS — I70219 Atherosclerosis of native arteries of extremities with intermittent claudication, unspecified extremity: Secondary | ICD-10-CM | POA: Insufficient documentation

## 2017-03-10 DIAGNOSIS — I70212 Atherosclerosis of native arteries of extremities with intermittent claudication, left leg: Secondary | ICD-10-CM | POA: Diagnosis not present

## 2017-03-10 NOTE — Progress Notes (Signed)
MRN : [id number]  Derek Henderson is a 71 y.o. (08-01-1945) male who presents with chief complaint of  Chief Complaint  Patient presents with  . Follow-up    2wk [facility] ABI  .  History of Present Illness: The patient returns to the office for followup and review status post angiogram with intervention on 02/11/2017. The patient notes improvement in the lower extremity symptoms. No interval shortening of the patient's claudication distance or rest pain symptoms. Previous wounds have now healed.  No new ulcers or wounds have occurred since the last visit.  There have been no significant changes to the patient's overall health care.  The patient denies amaurosis fugax or recent TIA symptoms. There are no recent neurological changes noted. The patient denies history of DVT, PE or superficial thrombophlebitis. The patient denies recent episodes of angina or shortness of breath.   ABI's Rt=1.15 and Lt=1.16  (previous ABI's Rt=1.21 and Lt=0.31)   Current Meds  Medication Sig  . aspirin EC 81 MG tablet Take 1 tablet (81 mg total) by mouth daily.  . Aspirin-Acetaminophen (GOODY BODY PAIN) 500-325 MG PACK Take 1 packet by mouth daily as needed (pain).  Marland Kitchen clopidogrel (PLAVIX) 75 MG tablet Take 1 tablet (75 mg total) by mouth daily.  . traMADol (ULTRAM) 50 MG tablet Take 50 mg by mouth 2 (two) times daily as needed for moderate pain.     Past Medical History:  Diagnosis Date  . Cancer ([place])    melanoma  . COPD (chronic obstructive pulmonary disease) ([place])   . Emphysema lung ([place])   . History of malignant melanoma   . Mild obstructive sleep apnea    can not afford CPAP machine  . PAD (peripheral artery disease) ([place])   . Panlobular emphysema ([place])   . Tobacco abuse     Past Surgical History:  Procedure Laterality Date  . HERNIA REPAIR Left   . LOWER EXTREMITY ANGIOGRAPHY Left 02/11/2017   Procedure: Lower Extremity Angiography;  Surgeon: Katha Cabal, MD;  Location: [facility];  Service: Cardiovascular;  Laterality: Left;  . LOWER EXTREMITY INTERVENTION  02/11/2017   Procedure: LOWER EXTREMITY INTERVENTION;  Surgeon: Katha Cabal, MD;  Location: [facility];  Service: Cardiovascular;;  . MELANOMA EXCISION Right 2010    Social History Social History   Tobacco Use  . Smoking status: Current Every Day Smoker    Packs/day: 1.00    Years: 45.00    Pack years: 45.00  . Smokeless tobacco: Current User    Types: Chew  Substance Use Topics  . Alcohol use: Yes    Comment: rare  . Drug use: No    Family History Family History  Problem Relation Age of Onset  . Cancer Mother        lung and breast  . Tuberculosis Mother   . Cancer Father        throat  . Cancer Sister        stomach  . Cancer Brother        Brain   . Cancer Sister        lung  . Cancer Brother        lung    Allergies  Allergen Reactions  . Codeine Other (See Comments)    Headache      REVIEW OF SYSTEMS (Negative unless checked)  Constitutional: [] Weight loss  [] Fever  [] Chills Cardiac: [] Chest pain   [] Chest pressure   [] Palpitations   []   Shortness of breath when laying flat   [] Shortness of breath with exertion. Vascular:  [] Pain in legs with walking   [] Pain in legs at rest  [] History of DVT   [] Phlebitis   [] Swelling in legs   [] Varicose veins   [] Non-healing ulcers Pulmonary:   [] Uses home oxygen   [] Productive cough   [] Hemoptysis   [] Wheeze  [] COPD   [] Asthma Neurologic:  [] Dizziness   [] Seizures   [] History of stroke   [] History of TIA  [] Aphasia   [] Vissual changes   [] Weakness or numbness in arm   [] Weakness or numbness in leg Musculoskeletal:   [] Joint swelling   [] Joint pain   [] Low back pain Hematologic:  [] Easy bruising  [] Easy bleeding   [] Hypercoagulable state   [] Anemic Gastrointestinal:  [] Diarrhea   [] Vomiting  [] Gastroesophageal reflux/heartburn   [] Difficulty swallowing. Genitourinary:  [] Chronic kidney disease   [] Difficult  urination  [] Frequent urination   [] Blood in urine Skin:  [] Rashes   [] Ulcers  Psychological:  [] History of anxiety   []  History of major depression.  Physical Examination  Vitals:   03/10/17 1500  BP: 124/73  Pulse: 70  Resp: 16  Weight: 72.1 kg (159 lb)   Body mass index is 20.98 kg/m. Gen: WD/WN, NAD Head: [place]/AT, No temporalis wasting.  Ear/Nose/Throat: Hearing grossly intact, nares w/o erythema or drainage Eyes: PER, EOMI, sclera nonicteric.  Neck: Supple, no large masses.   Pulmonary:  Good air movement, no audible wheezing bilaterally, no use of accessory muscles.  Cardiac: RRR, no JVD Vascular:  Toes pink and warm bilaterally Vessel Right Left  Radial Palpable Palpable  PT Palpable Palpable  DP Palpable Palpable  Gastrointestinal: Non-distended. No guarding/no peritoneal signs.  Musculoskeletal: M/S 5/5 throughout.  No deformity or atrophy.  Neurologic: CN 2-12 intact. Symmetrical.  Speech is fluent. Motor exam as listed above. Psychiatric: Judgment intact, Mood & affect appropriate for pt's clinical situation. Dermatologic: No rashes or ulcers noted.  No changes consistent with cellulitis. Lymph : No lichenification or skin changes of chronic lymphedema.  CBC No results found for: WBC, HGB, HCT, MCV, PLT  BMET    Component Value Date/Time   BUN 13 02/10/2017 0802   CREATININE 0.96 02/10/2017 0802   GFRNONAA >60 02/10/2017 0802   GFRAA >60 02/10/2017 0802   CrCl cannot be calculated (Patient's most recent lab result is older than the maximum 21 days allowed.).  COAG No results found for: INR, PROTIME  Radiology No results found.  Assessment/Plan 1. Atherosclerosis of native artery of left lower extremity with intermittent claudication (HCC) Recommend:  The patient is status post successful angiogram with intervention.  The patient reports that the claudication symptoms and leg pain is essentially gone.   The patient denies lifestyle limiting changes at  this point in time.  No further invasive studies, angiography or surgery at this time The patient should continue walking and begin a more formal exercise program.  The patient should continue antiplatelet therapy and aggressive treatment of the lipid abnormalities  Smoking cessation was again discussed  The patient should continue wearing graduated compression socks 10-15 mmHg strength to control the mild edema.  Patient should undergo noninvasive studies as ordered. The patient will follow up with me after the studies.   - VAS [place] ABI WITH/WO TBI; Future - VAS [place] LOWER EXTREMITY ARTERIAL DUPLEX; Future  2. Panlobular emphysema ([place]) Continue pulmonary medications and aerosols as already ordered, these medications have been reviewed and there are no changes at this  time.      Hortencia Pilar, MD  03/10/2017 9:18 PM

## 2017-05-19 DIAGNOSIS — Z125 Encounter for screening for malignant neoplasm of prostate: Secondary | ICD-10-CM | POA: Diagnosis not present

## 2017-05-19 DIAGNOSIS — Z79899 Other long term (current) drug therapy: Secondary | ICD-10-CM | POA: Diagnosis not present

## 2017-05-26 DIAGNOSIS — J431 Panlobular emphysema: Secondary | ICD-10-CM | POA: Diagnosis not present

## 2017-05-26 DIAGNOSIS — I739 Peripheral vascular disease, unspecified: Secondary | ICD-10-CM | POA: Diagnosis not present

## 2017-05-26 DIAGNOSIS — Z79899 Other long term (current) drug therapy: Secondary | ICD-10-CM | POA: Diagnosis not present

## 2017-05-26 DIAGNOSIS — Z125 Encounter for screening for malignant neoplasm of prostate: Secondary | ICD-10-CM | POA: Diagnosis not present

## 2017-05-26 DIAGNOSIS — Z0000 Encounter for general adult medical examination without abnormal findings: Secondary | ICD-10-CM | POA: Diagnosis not present

## 2017-06-12 ENCOUNTER — Encounter (INDEPENDENT_AMBULATORY_CARE_PROVIDER_SITE_OTHER): Payer: Medicare HMO

## 2017-06-12 ENCOUNTER — Ambulatory Visit (INDEPENDENT_AMBULATORY_CARE_PROVIDER_SITE_OTHER): Payer: Medicare HMO

## 2017-06-12 ENCOUNTER — Ambulatory Visit (INDEPENDENT_AMBULATORY_CARE_PROVIDER_SITE_OTHER): Payer: Medicare HMO | Admitting: Vascular Surgery

## 2017-06-12 DIAGNOSIS — I70212 Atherosclerosis of native arteries of extremities with intermittent claudication, left leg: Secondary | ICD-10-CM

## 2017-06-27 ENCOUNTER — Encounter (INDEPENDENT_AMBULATORY_CARE_PROVIDER_SITE_OTHER): Payer: Self-pay | Admitting: Vascular Surgery

## 2017-08-20 ENCOUNTER — Other Ambulatory Visit: Payer: Self-pay

## 2017-08-20 DIAGNOSIS — R911 Solitary pulmonary nodule: Secondary | ICD-10-CM

## 2017-08-29 ENCOUNTER — Other Ambulatory Visit (INDEPENDENT_AMBULATORY_CARE_PROVIDER_SITE_OTHER): Payer: Self-pay | Admitting: Vascular Surgery

## 2017-08-29 ENCOUNTER — Ambulatory Visit: Payer: Medicare HMO

## 2017-08-29 ENCOUNTER — Ambulatory Visit
Admission: RE | Admit: 2017-08-29 | Discharge: 2017-08-29 | Disposition: A | Discharge status: Home / Self Care | Payer: Medicare HMO | Source: Ambulatory Visit | Attending: Cardiothoracic Surgery | Admitting: Cardiothoracic Surgery

## 2017-08-29 ENCOUNTER — Encounter: Payer: Self-pay | Admitting: Cardiothoracic Surgery

## 2017-08-29 ENCOUNTER — Ambulatory Visit: Payer: Medicare HMO | Admitting: Cardiothoracic Surgery

## 2017-08-29 ENCOUNTER — Ambulatory Visit (INDEPENDENT_AMBULATORY_CARE_PROVIDER_SITE_OTHER): Payer: Medicare HMO | Admitting: Cardiothoracic Surgery

## 2017-08-29 VITALS — BP 162/78 | HR 54 | Temp 97.6°F | Resp 18 | Ht 72.0 in | Wt 152.6 lb

## 2017-08-29 DIAGNOSIS — R911 Solitary pulmonary nodule: Secondary | ICD-10-CM

## 2017-08-29 DIAGNOSIS — J439 Emphysema, unspecified: Secondary | ICD-10-CM | POA: Insufficient documentation

## 2017-08-29 DIAGNOSIS — I700 Atherosclerosis of aorta: Secondary | ICD-10-CM | POA: Insufficient documentation

## 2017-08-29 DIAGNOSIS — R918 Other nonspecific abnormal finding of lung field: Secondary | ICD-10-CM | POA: Insufficient documentation

## 2017-08-29 NOTE — Progress Notes (Signed)
  Patient ID: Derek Henderson, male   DOB: 10/03/1945, 72 y.o.   MRN: [id number]  HISTORY: He returns today in follow-up.  He continues to smoke at least a pack cigarettes per day.  We have been following him for multiple bilateral lung nodules and lung infiltrates.  He had a CT scan done today which have independently reviewed.  The patient states he has been doing quite well and continues to work full-time in the [employer].  He does not have any complaints although he has lost some weight.  This is despite the fact that he is still very skinny.  He states that his appetite is excellent and that he eats whatever he would like.   Vitals:   08/29/17 1103  BP: (!) 162/78  Pulse: (!) 54  Resp: 18  Temp: 97.6 F (36.4 C)  SpO2: 95%     EXAM:    Resp: Lungs are clear bilaterally but are very distant.  No respiratory distress, normal effort. Heart:  Regular without murmurs Abd:  Abdomen is soft, non distended and non tender. No masses are palpable.  There is no rebound and no guarding.  Neurological: Alert and oriented to person, place, and time. Coordination normal.  Skin: Skin is warm and dry. No rash noted. No diaphoretic. No erythema. No pallor.  Psychiatric: Normal mood and affect. Normal behavior. Judgment and thought content normal.    ASSESSMENT: I have independently reviewed the patient's CT scan and compared to his prior scans.  There are small bilateral lung nodules.  The dominant finding is a new left lower lobe opacity that is most likely related to infection or inflammation.  Of course a malignancy is not excluded in this elderly patient who is a smoker.   PLAN:   I had a long discussion with him about the need for follow-up.  However he does not appear to have any surgical diseases that I may be able to help with so I would like to refer him to 1 of our pulmonologist for additional treatment options.  The patient is agreeable to doing so.  We will set him  up to see 1 of our pulmonologist.  All of his questions were answered.    Nestor Lewandowsky, MD

## 2017-08-29 NOTE — Patient Instructions (Signed)
We will send the referral to [facility]. Someone from their office should call to schedule an appointment within 5 days. If you do not hear from their office please call us back and let us know so we can check on this for you.

## 2017-09-01 ENCOUNTER — Telehealth: Payer: Self-pay | Admitting: Cardiothoracic Surgery

## 2017-09-01 NOTE — Telephone Encounter (Signed)
Patients wife called I relayed patients appointment information.

## 2017-09-01 NOTE — Telephone Encounter (Signed)
I have called patient to inform him of his appointment with pulmonology. Dr Genevive Bi is the referring provider. No answer. I have left a message for him to call back.   Please advise patient of appointment:   09/05/17 @ 11:00am with Dr Ashby Dawes [facility]  If patient needs to change appointment patient will need to call [phone].

## 2017-09-04 ENCOUNTER — Ambulatory Visit: Payer: Medicare HMO

## 2017-09-04 NOTE — Progress Notes (Signed)
[facility] Consultation      Assessment and Plan:  COPD/emphysema. - Findings of severe COPD/emphysema seen on CT chest.  We will send for a lung function test at future visit. -Patient has symptoms of dyspnea on exertion, though these are mild due to the patient's high level of physical fitness and daily activity.  Prescribed albuterol inhaler, asked to use in 1 to 2 puffs as needed when he becomes dyspneic with exertion.  Lung nodule. -Patient has a history of multiple bilateral lung nodules, most of which have shrunk.  However he has a significant new finding of a 2.5 cm left lower lobe nodule which has developed over the last 1 year.  We will repeat CT scan in 3 months to look for any change.  If the nodule does not regress in size would recommend a PET scan to evaluate further.  Patient is at higher risk for complications  with biopsy due to severe emphysema.  Nicotine abuse. - Continued smoking approximately 1 pack/day.  Discussed with patient about repercussions of continued smoking and benefits of cessation.  He is not really ready to quit at this time.  Spent 3 minutes in discussion.  Obstructive sleep apnea. - Previously on CPAP and stopped.  Not ready to restart at this time. Orders Placed This Encounter  Procedures  . CT CHEST WO CONTRAST   Meds ordered this encounter  Medications  . albuterol (VENTOLIN HFA) 108 (90 Base) MCG/ACT inhaler    Sig: Inhale 1-2 puffs into the lungs every 6 (six) hours as needed for shortness of breath (use when you have trouble breathing.).    Dispense:  1 Inhaler    Refill:  5   Return in about 3 months (around 12/06/2017).   Date: 09/05/2017  MRN# [id number] WM SAHAGUN 07-24-1945  Referring Physician: Dr. Genevive Bi.  OTT ZIMMERLE is a 72 y.o. old male seen in consultation for chief complaint of:    Chief Complaint  Patient presents with  . Consult    Mass on left lung-Recent CT Chest 08-2017.     HPI:  He  has a diagnosed with emphysema, he is smoking about a ppd, he used to 2 ppd and went down to 1 ppd about a year ago. He is thinking about quitting, he got hypnotized and quit for about 5 months. But then he is around other smokers and restarted.  He denies reflux, he does have severe sinus drainage, does not take anything for it, usually comes on then he drinks hot coffee.  He works running heavy equipment, works daily. He has dyspnea with lifting and carrying things, or shovelling things. He might have to stop, and then catch his breath and then keep his breath. He has a rescue inhaler but not use it for these episodes.    Imaging personally reviewed, CT chest 08/29/2017, in comparison with previous, there is severe hyperinflation and air trapping consistent with severe emphysema.  There is a left 2.5 cm lower lobe nodule/opacity not seen on previous scan, there is mild subcarinal and paratracheal lymphadenopathy.  Nodule was not seen on previous CT chest on 08/30/2016.   PMHX:   Past Medical History:  Diagnosis Date  . Cancer ([place])    melanoma  . COPD (chronic obstructive pulmonary disease) ([place])   . Emphysema lung ([place])   . History of malignant melanoma   . Mild obstructive sleep apnea    can not afford CPAP machine  . PAD (peripheral artery  disease) ([place])   . Panlobular emphysema ([place])   . Tobacco abuse    Surgical Hx:  Past Surgical History:  Procedure Laterality Date  . HERNIA REPAIR Left   . LOWER EXTREMITY ANGIOGRAPHY Left 02/11/2017   Procedure: Lower Extremity Angiography;  Surgeon: Katha Cabal, MD;  Location: [facility];  Service: Cardiovascular;  Laterality: Left;  . LOWER EXTREMITY INTERVENTION  02/11/2017   Procedure: LOWER EXTREMITY INTERVENTION;  Surgeon: Katha Cabal, MD;  Location: [facility];  Service: Cardiovascular;;  . MELANOMA EXCISION Right 2010   Family Hx:  Family History  Problem Relation Age of Onset  . Cancer Mother         lung and breast  . Tuberculosis Mother   . Cancer Father        throat  . Cancer Sister        stomach  . Cancer Brother        Brain   . Cancer Sister        lung  . Cancer Brother        lung   Social Hx:   Social History   Tobacco Use  . Smoking status: Current Every Day Smoker    Packs/day: 1.00    Years: 45.00    Pack years: 45.00  . Smokeless tobacco: Current User    Types: Chew  Substance Use Topics  . Alcohol use: Yes    Comment: rare  . Drug use: No   Medication:    Current Outpatient Medications:  .  aspirin EC 81 MG tablet, Take 1 tablet (81 mg total) by mouth daily., Disp: 150 tablet, Rfl: 2 .  Aspirin-Acetaminophen (GOODY BODY PAIN) 500-325 MG PACK, Take 1 packet by mouth daily as needed (pain)., Disp: , Rfl:  .  clopidogrel (PLAVIX) 75 MG tablet, TAKE 1 TABLET BY MOUTH ONCE DAILY, Disp: 30 tablet, Rfl: 4 .  traMADol (ULTRAM) 50 MG tablet, Take 50 mg by mouth 2 (two) times daily as needed for moderate pain. , Disp: , Rfl:    Allergies:  Codeine  Review of Systems: Gen:  Denies  fever, sweats, chills HEENT: Denies blurred vision, double vision. bleeds, sore throat Cvc:  No dizziness, chest pain. Resp:   Denies cough or sputum production, shortness of breath Gi: Denies swallowing difficulty, stomach pain. Gu:  Denies bladder incontinence, burning urine Ext:   No Joint pain, stiffness. Skin: No skin rash,  hives  Endoc:  No polyuria, polydipsia. Psych: No depression, insomnia. Other:  All other systems were reviewed with the patient and were negative other that what is mentioned in the HPI.   Physical Examination:   VS: BP 122/80 (BP Location: Left Arm, Cuff Size: Normal)   Pulse (!) 59   Ht 6' 1.75" (1.873 m)   Wt 150 lb (68 kg)   SpO2 93%   BMI 19.39 kg/m   General Appearance: No distress  Neuro:without focal findings,  speech normal,  HEENT: PERRLA, EOM intact.   Pulmonary: normal breath sounds, No wheezing. Decreased air entry  bilaterally.  CardiovascularNormal S1,S2.  No m/r/g.   Abdomen: Benign, Soft, non-tender. Renal:  No costovertebral tenderness  GU:  No performed at this time. Endoc: No evident thyromegaly, no signs of acromegaly. Skin:   warm, no rashes, no ecchymosis  Extremities: normal, no cyanosis, clubbing.  Other findings:    LABORATORY PANEL:   CBC No results for input(s): WBC, HGB, HCT, PLT in the last 168 hours. ------------------------------------------------------------------------------------------------------------------  Chemistries  No results for input(s): NA, K, CL, CO2, GLUCOSE, BUN, CREATININE, CALCIUM, MG, AST, ALT, ALKPHOS, BILITOT in the last 168 hours.  Invalid input(s): GFRCGP ------------------------------------------------------------------------------------------------------------------  Cardiac Enzymes No results for input(s): TROPONINI in the last 168 hours. ------------------------------------------------------------  RADIOLOGY:  No results found.     Thank  you for the consultation and for allowing [facility] to assist in the care of your patient. Our recommendations are noted above.  Please contact us if we can be of further service.   Marda Stalker, MD.  Board Certified in Internal Medicine, Pulmonary Medicine, [place], and Sleep Medicine.  [facility] Office Number: [phone]  Patricia Pesa, M.D.  Merton Border, M.D  09/05/2017

## 2017-09-05 ENCOUNTER — Ambulatory Visit: Payer: Medicare HMO | Admitting: Internal Medicine

## 2017-09-05 ENCOUNTER — Encounter: Payer: Self-pay | Admitting: Internal Medicine

## 2017-09-05 VITALS — BP 122/80 | HR 59 | Ht 73.75 in | Wt 150.0 lb

## 2017-09-05 DIAGNOSIS — R911 Solitary pulmonary nodule: Secondary | ICD-10-CM | POA: Diagnosis not present

## 2017-09-05 DIAGNOSIS — G4733 Obstructive sleep apnea (adult) (pediatric): Secondary | ICD-10-CM

## 2017-09-05 DIAGNOSIS — Z720 Tobacco use: Secondary | ICD-10-CM

## 2017-09-05 DIAGNOSIS — R918 Other nonspecific abnormal finding of lung field: Secondary | ICD-10-CM

## 2017-09-05 MED ORDER — ALBUTEROL SULFATE HFA 108 (90 BASE) MCG/ACT IN AERS: 1.0000 | INHALATION_SPRAY | Freq: Four times a day (QID) | RESPIRATORY_TRACT | 5 refills | Status: DC | PRN

## 2017-09-05 NOTE — Patient Instructions (Signed)
Start inhaler 1-2 puffs when you are winded.  Will check CT chest in 3 months.   --Quitting smoking is the most important thing that you can do for your health.  --Quitting smoking will have greater affect on your health than any medicine that we can give you.

## 2017-11-06 ENCOUNTER — Telehealth: Payer: Self-pay | Admitting: Internal Medicine

## 2017-11-06 NOTE — Telephone Encounter (Signed)
Patient wife returning call to schedule CT Please call

## 2017-11-06 NOTE — Telephone Encounter (Signed)
Returned wife's call and scheduled CT Chest for 11/27/17 at 4:30 at [facility]. Follow Up appointment scheduled for 12/04/17 at 4:00 with Dr. Ashby Dawes.  Appointment information mailed to patient along with instructions.  Wife is aware of both appointments. Nothing else needed at this time. Rhonda J Cobb

## 2017-11-27 ENCOUNTER — Ambulatory Visit
Admission: RE | Admit: 2017-11-27 | Discharge: 2017-11-27 | Disposition: A | Discharge status: Home / Self Care | Payer: Medicare HMO | Source: Ambulatory Visit | Attending: Internal Medicine | Admitting: Internal Medicine

## 2017-11-27 DIAGNOSIS — I700 Atherosclerosis of aorta: Secondary | ICD-10-CM | POA: Insufficient documentation

## 2017-11-27 DIAGNOSIS — R911 Solitary pulmonary nodule: Secondary | ICD-10-CM

## 2017-11-27 DIAGNOSIS — R918 Other nonspecific abnormal finding of lung field: Secondary | ICD-10-CM | POA: Insufficient documentation

## 2017-11-27 DIAGNOSIS — J432 Centrilobular emphysema: Secondary | ICD-10-CM | POA: Diagnosis not present

## 2017-11-27 DIAGNOSIS — I2510 Atherosclerotic heart disease of native coronary artery without angina pectoris: Secondary | ICD-10-CM | POA: Insufficient documentation

## 2017-11-27 DIAGNOSIS — J449 Chronic obstructive pulmonary disease, unspecified: Secondary | ICD-10-CM | POA: Diagnosis not present

## 2017-12-03 NOTE — Progress Notes (Signed)
[facility]    Assessment and Plan:  COPD/emphysema. - Findings of severe COPD/emphysema seen on CT chest.  We will send for a lung function test at future visit. -Patient has symptoms of dyspnea on exertion, though these are mild due to the patient's high level of physical fitness and daily activity.  Prescribed albuterol inhaler, asked to use in 1 to 2 puffs as needed when he becomes dyspneic with exertion.  Lung nodule. -Patient has a history of multiple bilateral lung nodules, most of which have shrunk.  However he has a significant new finding of a 2.5 cm left lower lobe nodule which has developed over the last 1 year.  We will repeat CT scan in 3 months to look for any change.  If the nodule does not regress in size would recommend a PET scan to evaluate further.  Patient is at higher risk for complications  with biopsy due to severe emphysema.  Nicotine abuse. - Continued smoking approximately 1 pack/day.  Discussed with patient about repercussions of continued smoking and benefits of cessation.  He is not really ready to quit at this time.  Spent 3 minutes in discussion.  Obstructive sleep apnea. - Previously on CPAP and stopped.  Not ready to restart at this time.  Orders Placed This Encounter  Procedures  . CT CHEST WO CONTRAST    Return in about 1 year (around 12/05/2018).   Date: 12/03/2017  MRN# [id number] Derek Henderson 1945/07/27   Derek Henderson is a 72 y.o. old male seen in consultation for chief complaint of:    Chief Complaint  Patient presents with  . Follow-up    prod cough w/green to white mucus: wheezing    HPI:  He has a diagnosed with emphysema, he is smoking about a ppd, he used to 2 ppd and went down to 1 ppd about a year ago, he got hypnotized and quit for about 5 months. Currently smoking about a ppd.  He denies reflux, he does have severe sinus drainage, does not take anything for it, usually comes on then he drinks hot  coffee.  He works running heavy equipment, works daily. He has dyspnea with lifting and carrying things, or shovelling things.  He is using proventil 2 puffs every morning which helps. He does not take it with him to work.   CT chest 11/27/2017 in comparison with previous 08/29/2017, 04/19/17>> images personally reviewed, there is severe hyperinflation and air trapping consistent with severe emphysema.  The previously seen left 2.5 cm lower lobe nodule/opacity has resolved.  Stable tiny right lung nodules are 4 mm or less, unchanged.  Social Hx:   Social History   Tobacco Use  . Smoking status: Current Every Day Smoker    Packs/day: 1.00    Years: 45.00    Pack years: 45.00  . Smokeless tobacco: Current User    Types: Chew  Substance Use Topics  . Alcohol use: Yes    Comment: rare  . Drug use: No   Medication:    Current Outpatient Medications:  .  albuterol (VENTOLIN HFA) 108 (90 Base) MCG/ACT inhaler, Inhale 1-2 puffs into the lungs every 6 (six) hours as needed for shortness of breath (use when you have trouble breathing.)., Disp: 1 Inhaler, Rfl: 5 .  aspirin EC 81 MG tablet, Take 1 tablet (81 mg total) by mouth daily., Disp: 150 tablet, Rfl: 2 .  Aspirin-Acetaminophen (GOODY BODY PAIN) 500-325 MG PACK, Take 1 packet by mouth  daily as needed (pain)., Disp: , Rfl:  .  clopidogrel (PLAVIX) 75 MG tablet, TAKE 1 TABLET BY MOUTH ONCE DAILY, Disp: 30 tablet, Rfl: 4 .  traMADol (ULTRAM) 50 MG tablet, Take 50 mg by mouth 2 (two) times daily as needed for moderate pain. , Disp: , Rfl:    Allergies:  Codeine  Review of Systems:  Constitutional: Feels well. Cardiovascular: No chest pain.  Pulmonary: Denies hemoptysis.  The remainder of systems were reviewed and were found to be negative other than what is documented in the HPI.    Physical Examination:   VS: BP (!) 148/78 (BP Location: Left Arm, Cuff Size: Normal)   Pulse (!) 58   Ht 6' 1.75" (1.873 m)   Wt 149 lb (67.6 kg)   SpO2  94%   BMI 19.26 kg/m   General Appearance: No distress  Neuro:without focal findings, mental status, speech normal, alert and oriented HEENT: PERRLA, EOM intact Pulmonary: No wheezing, No rales  CardiovascularNormal S1,S2.  No m/r/g.  Abdomen: Benign, Soft, non-tender, No masses Renal:  No costovertebral tenderness  GU:  No performed at this time. Endoc: No evident thyromegaly, no signs of acromegaly or Cushing features Skin:   warm, no rashes, no ecchymosis  Extremities: normal, no cyanosis, clubbing.      LABORATORY PANEL:   CBC No results for input(s): WBC, HGB, HCT, PLT in the last 168 hours. ------------------------------------------------------------------------------------------------------------------  Chemistries  No results for input(s): NA, K, CL, CO2, GLUCOSE, BUN, CREATININE, CALCIUM, MG, AST, ALT, ALKPHOS, BILITOT in the last 168 hours.  Invalid input(s): GFRCGP ------------------------------------------------------------------------------------------------------------------  Cardiac Enzymes No results for input(s): TROPONINI in the last 168 hours. ------------------------------------------------------------  RADIOLOGY:  No results found.     Thank  you for the consultation and for allowing [facility] to assist in the care of your patient. Our recommendations are noted above.  Please contact us if we can be of further service.  Marda Stalker, M.D., F.C.C.P.  Board Certified in Internal Medicine, Pulmonary Medicine, [place], and Sleep Medicine.  [facility] Office Number: [phone]   12/03/2017

## 2017-12-04 ENCOUNTER — Ambulatory Visit (INDEPENDENT_AMBULATORY_CARE_PROVIDER_SITE_OTHER): Payer: Medicare HMO | Admitting: Internal Medicine

## 2017-12-04 ENCOUNTER — Encounter: Payer: Self-pay | Admitting: Internal Medicine

## 2017-12-04 VITALS — BP 148/78 | HR 58 | Ht 73.75 in | Wt 149.0 lb

## 2017-12-04 DIAGNOSIS — G4733 Obstructive sleep apnea (adult) (pediatric): Secondary | ICD-10-CM | POA: Diagnosis not present

## 2017-12-04 DIAGNOSIS — R911 Solitary pulmonary nodule: Secondary | ICD-10-CM

## 2017-12-04 DIAGNOSIS — R918 Other nonspecific abnormal finding of lung field: Secondary | ICD-10-CM

## 2017-12-04 NOTE — Patient Instructions (Signed)
Continue using inhaler, can use as needed.  Will recheck Ct chest in 1 year.

## 2017-12-08 ENCOUNTER — Ambulatory Visit (INDEPENDENT_AMBULATORY_CARE_PROVIDER_SITE_OTHER): Payer: Medicare HMO

## 2017-12-08 ENCOUNTER — Encounter (INDEPENDENT_AMBULATORY_CARE_PROVIDER_SITE_OTHER): Payer: Self-pay | Admitting: Vascular Surgery

## 2017-12-08 ENCOUNTER — Other Ambulatory Visit (INDEPENDENT_AMBULATORY_CARE_PROVIDER_SITE_OTHER): Payer: Self-pay | Admitting: Vascular Surgery

## 2017-12-08 ENCOUNTER — Ambulatory Visit (INDEPENDENT_AMBULATORY_CARE_PROVIDER_SITE_OTHER): Payer: Medicare HMO | Admitting: Vascular Surgery

## 2017-12-08 VITALS — BP 139/80 | HR 58 | Resp 16 | Ht 73.5 in | Wt 158.0 lb

## 2017-12-08 DIAGNOSIS — J431 Panlobular emphysema: Secondary | ICD-10-CM

## 2017-12-08 DIAGNOSIS — Z95820 Peripheral vascular angioplasty status with implants and grafts: Secondary | ICD-10-CM

## 2017-12-08 DIAGNOSIS — M79606 Pain in leg, unspecified: Secondary | ICD-10-CM | POA: Diagnosis not present

## 2017-12-08 DIAGNOSIS — G4733 Obstructive sleep apnea (adult) (pediatric): Secondary | ICD-10-CM | POA: Diagnosis not present

## 2017-12-08 DIAGNOSIS — I70249 Atherosclerosis of native arteries of left leg with ulceration of unspecified site: Secondary | ICD-10-CM

## 2017-12-08 DIAGNOSIS — I739 Peripheral vascular disease, unspecified: Secondary | ICD-10-CM

## 2017-12-08 DIAGNOSIS — I70239 Atherosclerosis of native arteries of right leg with ulceration of unspecified site: Secondary | ICD-10-CM

## 2017-12-10 ENCOUNTER — Encounter (INDEPENDENT_AMBULATORY_CARE_PROVIDER_SITE_OTHER): Payer: Self-pay | Admitting: Vascular Surgery

## 2017-12-10 DIAGNOSIS — M79606 Pain in leg, unspecified: Secondary | ICD-10-CM | POA: Insufficient documentation

## 2017-12-10 NOTE — Progress Notes (Signed)
MRN : [id number]  Derek Henderson is a 72 y.o. ([birth year]) male who presents with chief complaint of  Chief Complaint  Patient presents with  . Follow-up    56month abi,le arterial  .  History of Present Illness:   The patient returns to the office for followup and review status post angiogram with intervention on 02/11/2017. The patient notes improvement in the lower extremity symptoms. No interval shortening of the patient's claudication distance or rest pain symptoms. Previous wounds have now healed.  No new ulcers or wounds have occurred since the last visit.  There have been no significant changes to the patient's overall health care.  The patient denies amaurosis fugax or recent TIA symptoms. There are no recent neurological changes noted. The patient denies history of DVT, PE or superficial thrombophlebitis. The patient denies recent episodes of angina or shortness of breath.   ABI's Rt=1.04 and Lt=1.14  (previous ABI's Rt=1.15 and Lt=1.16) Duplex ultrasound shows triphasic flow throught the SFA/stent   Current Meds  Medication Sig  . albuterol (VENTOLIN HFA) 108 (90 Base) MCG/ACT inhaler Inhale 1-2 puffs into the lungs every 6 (six) hours as needed for shortness of breath (use when you have trouble breathing.).  Marland Kitchen aspirin EC 81 MG tablet Take 1 tablet (81 mg total) by mouth daily.  . clopidogrel (PLAVIX) 75 MG tablet TAKE 1 TABLET BY MOUTH ONCE DAILY    Past Medical History:  Diagnosis Date  . Cancer ([place])    melanoma  . COPD (chronic obstructive pulmonary disease) ([place])   . Emphysema lung ([place])   . History of malignant melanoma   . Mild obstructive sleep apnea    can not afford CPAP machine  . PAD (peripheral artery disease) ([place])   . Panlobular emphysema ([place])   . Tobacco abuse     Past Surgical History:  Procedure Laterality Date  . HERNIA REPAIR Left   . LOWER EXTREMITY ANGIOGRAPHY Left 02/11/2017   Procedure: Lower Extremity Angiography;  Surgeon:  Katha Cabal, MD;  Location: [facility];  Service: Cardiovascular;  Laterality: Left;  . LOWER EXTREMITY INTERVENTION  02/11/2017   Procedure: LOWER EXTREMITY INTERVENTION;  Surgeon: Katha Cabal, MD;  Location: [facility];  Service: Cardiovascular;;  . MELANOMA EXCISION Right 2010    Social History Social History   Tobacco Use  . Smoking status: Current Every Day Smoker    Packs/day: 1.00    Years: 45.00    Pack years: 45.00  . Smokeless tobacco: Current User    Types: Chew  Substance Use Topics  . Alcohol use: Yes    Comment: rare  . Drug use: No    Family History Family History  Problem Relation Age of Onset  . Cancer Mother        lung and breast  . Tuberculosis Mother   . Cancer Father        throat  . Cancer Sister        stomach  . Cancer Brother        Brain   . Cancer Sister        lung  . Cancer Brother        lung    Allergies  Allergen Reactions  . Codeine Other (See Comments)    Headache      REVIEW OF SYSTEMS (Negative unless checked)  Constitutional: [] Weight loss  [] Fever  [] Chills Cardiac: [] Chest pain   [] Chest pressure   [] Palpitations   []   Shortness of breath when laying flat   [] Shortness of breath with exertion. Vascular:  [x] Pain in legs with walking   [] Pain in legs at rest  [] History of DVT   [] Phlebitis   [] Swelling in legs   [] Varicose veins   [] Non-healing ulcers Pulmonary:   [] Uses home oxygen   [] Productive cough   [] Hemoptysis   [] Wheeze  [] COPD   [] Asthma Neurologic:  [] Dizziness   [] Seizures   [] History of stroke   [] History of TIA  [] Aphasia   [] Vissual changes   [] Weakness or numbness in arm   [] Weakness or numbness in leg Musculoskeletal:   [] Joint swelling   [x] Joint pain   [x] Low back pain Hematologic:  [] Easy bruising  [] Easy bleeding   [] Hypercoagulable state   [] Anemic Gastrointestinal:  [] Diarrhea   [] Vomiting  [] Gastroesophageal reflux/heartburn   [] Difficulty swallowing. Genitourinary:   [] Chronic kidney disease   [] Difficult urination  [] Frequent urination   [] Blood in urine Skin:  [] Rashes   [] Ulcers  Psychological:  [] History of anxiety   []  History of major depression.  Physical Examination  Vitals:   12/08/17 1423  BP: 139/80  Pulse: (!) 58  Resp: 16  Weight: 158 lb (71.7 kg)  Height: 6' 1.5" (1.867 m)   Body mass index is 20.56 kg/m. Gen: WD/WN, NAD Head: [place]/AT, No temporalis wasting.  Ear/Nose/Throat: Hearing grossly intact, nares w/o erythema or drainage Eyes: PER, EOMI, sclera nonicteric.  Neck: Supple, no large masses.   Pulmonary:  Good air movement, no audible wheezing bilaterally, no use of accessory muscles.  Cardiac: RRR, no JVD Vascular:  Vessel Right Left  Radial Palpable Palpable  PT Palpable Palpable  DP Palpable Trace Palpable  Gastrointestinal: Non-distended. No guarding/no peritoneal signs.  Musculoskeletal: M/S 5/5 throughout.  No deformity or atrophy.  Neurologic: CN 2-12 intact. Symmetrical.  Speech is fluent. Motor exam as listed above. Psychiatric: Judgment intact, Mood & affect appropriate for pt's clinical situation. Dermatologic: No rashes or ulcers noted.  No changes consistent with cellulitis. Lymph : No lichenification or skin changes of chronic lymphedema.  CBC No results found for: WBC, HGB, HCT, MCV, PLT  BMET    Component Value Date/Time   BUN 13 02/10/2017 0802   CREATININE 0.96 02/10/2017 0802   GFRNONAA >60 02/10/2017 0802   GFRAA >60 02/10/2017 0802   CrCl cannot be calculated (Patient's most recent lab result is older than the maximum 21 days allowed.).  COAG No results found for: INR, PROTIME  Radiology Ct Chest Wo Contrast  Result Date: 11/28/2017 CLINICAL DATA:  Follow-up pulmonary nodule.  COPD. EXAM: CT CHEST WITHOUT CONTRAST TECHNIQUE: Multidetector CT imaging of the chest was performed following the standard protocol without IV contrast. COMPARISON:  08/29/2017 chest CT. FINDINGS: Cardiovascular:  Normal heart size. No significant pericardial effusion/thickening. Three-vessel coronary atherosclerosis. Atherosclerotic nonaneurysmal thoracic aorta. Stable top-normal caliber main pulmonary artery (3.2 cm diameter). Mediastinum/Nodes: No discrete thyroid nodules. Unremarkable esophagus. No pathologically enlarged axillary, mediastinal or hilar lymph nodes, noting limited sensitivity for the detection of hilar adenopathy on this noncontrast study. Lungs/Pleura: No pneumothorax. No pleural effusion. Moderate to severe centrilobular emphysema with mild diffuse bronchial wall thickening. Previously described 2.5 cm left lower lobe pulmonary nodule has resolved, with residual small linear scar in this location (series 3/image 154). No acute consolidative airspace disease or lung masses. Several tiny scattered solid pulmonary nodules in the right lung measuring up to 4 mm in the right upper lobe (series 3/image 47) are unchanged since 04/19/2016 chest CT and  probably benign. No new significant pulmonary nodules. Upper abdomen: No acute abnormality. Musculoskeletal: No aggressive appearing focal osseous lesions. Mild thoracic spondylosis. IMPRESSION: 1. Previously described 2.5 cm left lower lobe pulmonary nodule is absent on today's scan with small residual linear scar in this location, compatible with resolved inflammatory lesion. 2. Several tiny pulmonary nodules in the right lung measuring up to 4 mm are all stable since 04/19/2016 chest CT and probably benign. Suggest follow-up chest CT in 12 months to document 2 year stability in this high risk patient. 3. Moderate to severe centrilobular emphysema with mild diffuse bronchial wall thickening, compatible with the provided history of COPD. 4. Three-vessel coronary atherosclerosis. Aortic Atherosclerosis (ICD10-I70.0) and Emphysema (ICD10-J43.9). Electronically Signed   By: Ilona Sorrel M.D.   On: 11/28/2017 08:22     Assessment/Plan 1. Peripheral artery disease  ([place]) Recommend:  The patient is status post successful angiogram with intervention.  The patient reports that the claudication symptoms and leg pain is essentially gone.   The patient denies lifestyle limiting changes at this point in time.  No further invasive studies, angiography or surgery at this time The patient should continue walking and begin a more formal exercise program.  The patient should continue antiplatelet therapy and aggressive treatment of the lipid abnormalities  Smoking cessation was again discussed  The patient should continue wearing graduated compression socks 10-15 mmHg strength to control the mild edema.  Patient should undergo noninvasive studies as ordered. The patient will follow up with me after the studies.   - VAS [place] ABI WITH/WO TBI; Future - VAS [place] LOWER EXTREMITY ARTERIAL DUPLEX; Future  2. Panlobular emphysema ([place]) Continue pulmonary medications and aerosols as already ordered, these medications have been reviewed and there are no changes at this time.  3. Mild obstructive sleep apnea Continue CPAP nightly as ordered and reviewed, no changes at this time   4. Pain of lower extremity, unspecified laterality Continue Tramadol as ordered by the primary service and reviewed, no changes at this time     Hortencia Pilar, MD  12/10/2017 8:04 PM

## 2017-12-12 ENCOUNTER — Other Ambulatory Visit (INDEPENDENT_AMBULATORY_CARE_PROVIDER_SITE_OTHER): Payer: Self-pay

## 2017-12-12 MED ORDER — CLOPIDOGREL BISULFATE 75 MG PO TABS: 75.0000 mg | ORAL_TABLET | Freq: Every day | ORAL | 7 refills | Status: DC

## 2018-05-20 DIAGNOSIS — Z79899 Other long term (current) drug therapy: Secondary | ICD-10-CM | POA: Diagnosis not present

## 2018-05-20 DIAGNOSIS — Z125 Encounter for screening for malignant neoplasm of prostate: Secondary | ICD-10-CM | POA: Diagnosis not present

## 2018-05-27 DIAGNOSIS — Z125 Encounter for screening for malignant neoplasm of prostate: Secondary | ICD-10-CM | POA: Diagnosis not present

## 2018-05-27 DIAGNOSIS — J431 Panlobular emphysema: Secondary | ICD-10-CM | POA: Diagnosis not present

## 2018-05-27 DIAGNOSIS — I739 Peripheral vascular disease, unspecified: Secondary | ICD-10-CM | POA: Diagnosis not present

## 2018-05-27 DIAGNOSIS — E538 Deficiency of other specified B group vitamins: Secondary | ICD-10-CM | POA: Diagnosis not present

## 2018-05-27 DIAGNOSIS — Z79899 Other long term (current) drug therapy: Secondary | ICD-10-CM | POA: Diagnosis not present

## 2018-05-27 DIAGNOSIS — Z0000 Encounter for general adult medical examination without abnormal findings: Secondary | ICD-10-CM | POA: Diagnosis not present

## 2018-06-10 ENCOUNTER — Other Ambulatory Visit (INDEPENDENT_AMBULATORY_CARE_PROVIDER_SITE_OTHER): Payer: Self-pay | Admitting: Vascular Surgery

## 2018-12-02 ENCOUNTER — Ambulatory Visit: Payer: Medicare HMO

## 2018-12-04 ENCOUNTER — Ambulatory Visit
Admission: RE | Admit: 2018-12-04 | Discharge: 2018-12-04 | Disposition: A | Discharge status: Home / Self Care | Payer: Medicare HMO | Source: Ambulatory Visit | Attending: Internal Medicine | Admitting: Internal Medicine

## 2018-12-04 ENCOUNTER — Other Ambulatory Visit: Payer: Self-pay

## 2018-12-04 DIAGNOSIS — R918 Other nonspecific abnormal finding of lung field: Secondary | ICD-10-CM | POA: Diagnosis not present

## 2018-12-04 DIAGNOSIS — J439 Emphysema, unspecified: Secondary | ICD-10-CM | POA: Diagnosis not present

## 2018-12-09 ENCOUNTER — Telehealth: Payer: Self-pay | Admitting: Internal Medicine

## 2018-12-09 NOTE — Telephone Encounter (Signed)
Called patient for COVID-19 pre-screening for in office visit. ° °Have you recently traveled any where out of the local area in the last 2 weeks? No ° °Have you been in close contact with a person diagnosed with COVID-19 or someone awaiting results within the last 2 weeks? No ° °Do you currently have any of the following symptoms? If so, when did they start? °Cough     Diarrhea   Joint Pain °Fever      Muscle Pain   Red eyes °Shortness of breath   Abdominal pain  Vomiting °Loss of smell    Rash    Sore Throat °Headache    Weakness   Bruising or bleeding ° ° °Okay to proceed with visit.12/10/2018 ° ° °

## 2018-12-10 ENCOUNTER — Ambulatory Visit (INDEPENDENT_AMBULATORY_CARE_PROVIDER_SITE_OTHER): Payer: Medicare HMO | Admitting: Nurse Practitioner

## 2018-12-10 ENCOUNTER — Encounter (INDEPENDENT_AMBULATORY_CARE_PROVIDER_SITE_OTHER): Payer: Self-pay | Admitting: Nurse Practitioner

## 2018-12-10 ENCOUNTER — Ambulatory Visit (INDEPENDENT_AMBULATORY_CARE_PROVIDER_SITE_OTHER): Payer: Medicare HMO

## 2018-12-10 ENCOUNTER — Other Ambulatory Visit: Payer: Self-pay

## 2018-12-10 ENCOUNTER — Encounter: Payer: Self-pay | Admitting: Internal Medicine

## 2018-12-10 ENCOUNTER — Ambulatory Visit (INDEPENDENT_AMBULATORY_CARE_PROVIDER_SITE_OTHER): Payer: Medicare HMO | Admitting: Internal Medicine

## 2018-12-10 VITALS — BP 128/70 | HR 77 | Temp 98.2°F | Ht 73.0 in | Wt 151.4 lb

## 2018-12-10 VITALS — BP 130/73 | HR 66 | Resp 12 | Ht 73.0 in | Wt 150.0 lb

## 2018-12-10 DIAGNOSIS — F17210 Nicotine dependence, cigarettes, uncomplicated: Secondary | ICD-10-CM

## 2018-12-10 DIAGNOSIS — I739 Peripheral vascular disease, unspecified: Secondary | ICD-10-CM | POA: Diagnosis not present

## 2018-12-10 DIAGNOSIS — Z720 Tobacco use: Secondary | ICD-10-CM | POA: Diagnosis not present

## 2018-12-10 DIAGNOSIS — R918 Other nonspecific abnormal finding of lung field: Secondary | ICD-10-CM

## 2018-12-10 DIAGNOSIS — J431 Panlobular emphysema: Secondary | ICD-10-CM

## 2018-12-10 DIAGNOSIS — R911 Solitary pulmonary nodule: Secondary | ICD-10-CM

## 2018-12-10 DIAGNOSIS — G4733 Obstructive sleep apnea (adult) (pediatric): Secondary | ICD-10-CM

## 2018-12-10 MED ORDER — ALBUTEROL SULFATE HFA 108 (90 BASE) MCG/ACT IN AERS: 1.0000 | INHALATION_SPRAY | Freq: Four times a day (QID) | RESPIRATORY_TRACT | 3 refills | Status: DC | PRN

## 2018-12-10 NOTE — Progress Notes (Signed)
[facility]    Assessment and Plan:  COPD/emphysema. - Findings of severe COPD/emphysema seen on CT chest.  - Patient has fairly high functional status, and good physical fitness.  He therefore has minimal respiratory symptoms. - Recommend that he continue to keep an elevated functional status, his Ventolin as needed is renewed today.  Lung nodule. -Patient has a history of multiple bilateral lung nodules, most of which have shrunk.   --Appear low risk, repeat Ct chest in 1 year.   Nicotine abuse. - Continued smoking approximately 1 pack/day.  Spent 3 minutes in discussion, not sure that he is ready to quit at this time, though he thinks that he may be ready to cut down, we discussed decreasing from 1 pack/day to half pack per day.  Obstructive sleep apnea. - Previously on CPAP and stopped.  We discussed potentially restarting CPAP but he is not ready to restart at this time.  Orders Placed This Encounter  Procedures  . CT CHEST WO CONTRAST    No follow-ups on file.   Date: 12/10/2018  MRN# [id number] Derek Henderson 04-Jun-1945   Derek Henderson is a 73 y.o. old male seen in consultation for chief complaint of:    Chief Complaint  Patient presents with  . Follow-up    CT 12/04/2018- no current sx.     HPI:  He has a diagnosed with emphysema. Currently he using an inhaler once every 2 to 3 days, and feels that he does fine with that. He has dyspnea with with physical activity like shoveling dirt. 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  He continues to smoke about a ppd, he has tried chantix but did not help, not really thinking about quitting right now.   He works running heavy equipment, works daily. He has dyspnea with lifting and carrying things, or shovelling things.  He is using proventil 2 puffs every morning which helps. He does not take it with him to work.   CT chest 12/04/18 and 11/27/2017 in comparison with previous  08/29/2017, 04/19/16>> images personally reviewed, there is severe hyperinflation and air trapping consistent with severe emphysema.  The previously seen left 2.5 cm lower lobe nodule/opacity has resolved.  Stable tiny right lung nodules are 4 mm or less, unchanged.  Social Hx:   Social History   Tobacco Use  . Smoking status: Current Every Day Smoker    Packs/day: 1.00    Years: 45.00    Pack years: 45.00  . Smokeless tobacco: Current User    Types: Chew  Substance Use Topics  . Alcohol use: Yes    Comment: rare  . Drug use: No   Medication:    Current Outpatient Medications:  .  albuterol (VENTOLIN HFA) 108 (90 Base) MCG/ACT inhaler, Inhale 1-2 puffs into the lungs every 6 (six) hours as needed for shortness of breath (use when you have trouble breathing.)., Disp: 1 Inhaler, Rfl: 5 .  aspirin EC 81 MG tablet, Take 1 tablet (81 mg total) by mouth daily., Disp: 150 tablet, Rfl: 2 .  Aspirin-Acetaminophen (GOODY BODY PAIN) 500-325 MG PACK, Take 1 packet by mouth daily as needed (pain)., Disp: , Rfl:  .  clopidogrel (PLAVIX) 75 MG tablet, TAKE 1 TABLET EVERY DAY, Disp: 90 tablet, Rfl: 3 .  traMADol (ULTRAM) 50 MG tablet, Take 50 mg by mouth 2 (two) times daily as needed for moderate pain. , Disp: , Rfl:    Allergies:  Codeine  LABORATORY PANEL:   CBC No results for input(s): WBC, HGB, HCT, PLT in the last 168 hours. ------------------------------------------------------------------------------------------------------------------  Chemistries  No results for input(s): NA, K, CL, CO2, GLUCOSE, BUN, CREATININE, CALCIUM, MG, AST, ALT, ALKPHOS, BILITOT in the last 168 hours.  Invalid input(s): GFRCGP ------------------------------------------------------------------------------------------------------------------  Cardiac Enzymes No results for input(s): TROPONINI in the last 168 hours. ------------------------------------------------------------  RADIOLOGY:  No results  found.     Thank  you for the consultation and for allowing [facility] to assist in the care of your patient. Our recommendations are noted above.  Please contact us if we can be of further service.  Marda Stalker, M.D., F.C.C.P.  Board Certified in Internal Medicine, Pulmonary Medicine, [place], and Sleep Medicine.  [facility] Office Number: [phone]   12/10/2018

## 2018-12-10 NOTE — Patient Instructions (Signed)
Continue to use inhaler as needed.   --Quitting smoking is the most important thing that you can do for your health.  --Quitting smoking will have greater affect on your health than any medicine that we can give you.

## 2018-12-10 NOTE — Progress Notes (Signed)
SUBJECTIVE:  Patient ID: Derek Henderson, male    DOB: 02/15/1946, 73 y.o.   MRN: [id number] Chief Complaint  Patient presents with  . Follow-up    HPI  Derek Henderson is a 73 y.o. male The patient returns to the office for followup and review of the noninvasive studies. There have been no interval changes in lower extremity symptoms. No interval shortening of the patient's claudication distance or development of rest pain symptoms. No new ulcers or wounds have occurred since the last visit.  There have been no significant changes to the patient's overall health care.  The patient denies amaurosis fugax or recent TIA symptoms. There are no recent neurological changes noted. The patient denies history of DVT, PE or superficial thrombophlebitis. The patient denies recent episodes of angina or shortness of breath.   ABI Rt=1.19 and Lt=1.08  (previous ABI's Rt=1.04 and Lt=1.14) Duplex ultrasound of the bilateral lower extremities reveals mainly triphasic waveforms.  The bilateral tibial artery waveforms are triphasic with strong toe waveforms bilaterally.  Past Medical History:  Diagnosis Date  . Cancer ([place])    melanoma  . COPD (chronic obstructive pulmonary disease) ([place])   . Emphysema lung ([place])   . History of malignant melanoma   . Mild obstructive sleep apnea    can not afford CPAP machine  . PAD (peripheral artery disease) ([place])   . Panlobular emphysema ([place])   . Tobacco abuse     Past Surgical History:  Procedure Laterality Date  . HERNIA REPAIR Left   . LOWER EXTREMITY ANGIOGRAPHY Left 02/11/2017   Procedure: Lower Extremity Angiography;  Surgeon: Katha Cabal, MD;  Location: [facility];  Service: Cardiovascular;  Laterality: Left;  . LOWER EXTREMITY INTERVENTION  02/11/2017   Procedure: LOWER EXTREMITY INTERVENTION;  Surgeon: Katha Cabal, MD;  Location: [facility];  Service: Cardiovascular;;  . MELANOMA EXCISION Right 2010     Social History   Socioeconomic History  . Marital status: Married    Spouse name: Not on file  . Number of children: Not on file  . Years of education: Not on file  . Highest education level: Not on file  Occupational History  . Not on file  Social Needs  . Financial resource strain: Not on file  . Food insecurity    Worry: Not on file    Inability: Not on file  . Transportation needs    Medical: Not on file    Non-medical: Not on file  Tobacco Use  . Smoking status: Current Every Day Smoker    Packs/day: 1.00    Years: 45.00    Pack years: 45.00  . Smokeless tobacco: Current User    Types: Chew  Substance and Sexual Activity  . Alcohol use: Yes    Comment: rare  . Drug use: No  . Sexual activity: Not on file  Lifestyle  . Physical activity    Days per week: Not on file    Minutes per session: Not on file  . Stress: Not on file  Relationships  . Social Herbalist on phone: Not on file    Gets together: Not on file    Attends religious service: Not on file    Active member of club or organization: Not on file    Attends meetings of clubs or organizations: Not on file    Relationship status: Not on file  . Intimate partner violence    Fear of  current or ex partner: Not on file    Emotionally abused: Not on file    Physically abused: Not on file    Forced sexual activity: Not on file  Other Topics Concern  . Not on file  Social History Narrative  . Not on file    Family History  Problem Relation Age of Onset  . Cancer Mother        lung and breast  . Tuberculosis Mother   . Cancer Father        throat  . Cancer Sister        stomach  . Cancer Brother        Brain   . Cancer Sister        lung  . Cancer Brother        lung    Allergies  Allergen Reactions  . Codeine Other (See Comments)    Headache      Review of Systems   Review of Systems: Negative Unless Checked Constitutional: [] Weight loss  [] Fever  [] Chills Cardiac:  [] Chest pain   []  Atrial Fibrillation  [] Palpitations   [] Shortness of breath when laying flat   [] Shortness of breath with exertion. [] Shortness of breath at rest Vascular:  [] Pain in legs with walking   [] Pain in legs with standing [] Pain in legs when laying flat   [] Claudication    [] Pain in feet when laying flat    [] History of DVT   [] Phlebitis   [] Swelling in legs   [] Varicose veins   [] Non-healing ulcers Pulmonary:   [] Uses home oxygen   [] Productive cough   [] Hemoptysis   [] Wheeze  [x] COPD   [] Asthma Neurologic:  [] Dizziness   [] Seizures  [] Blackouts [] History of stroke   [] History of TIA  [] Aphasia   [] Temporary Blindness   [] Weakness or numbness in arm   [] Weakness or numbness in leg Musculoskeletal:   [] Joint swelling   [] Joint pain   [] Low back pain  []  History of Knee Replacement [] Arthritis [] back Surgeries  []  Spinal Stenosis    Hematologic:  [] Easy bruising  [] Easy bleeding   [] Hypercoagulable state   [] Anemic Gastrointestinal:  [] Diarrhea   [] Vomiting  [] Gastroesophageal reflux/heartburn   [] Difficulty swallowing. [] Abdominal pain Genitourinary:  [] Chronic kidney disease   [] Difficult urination  [] Anuric   [] Blood in urine [] Frequent urination  [] Burning with urination   [] Hematuria Skin:  [] Rashes   [] Ulcers [] Wounds Psychological:  [] History of anxiety   []  History of major depression  []  Memory Difficulties      OBJECTIVE:   Physical Exam  BP 130/73 (BP Location: Left Arm, Patient Position: Sitting, Cuff Size: Normal)   Pulse 66   Resp 12   Ht 6\' 1"  (1.854 m)   Wt 150 lb (68 kg)   BMI 19.79 kg/m   Gen: WD/WN, NAD Head: [place]/AT, No temporalis wasting.  Ear/Nose/Throat: Hearing grossly intact, nares w/o erythema or drainage Eyes: PER, EOMI, sclera nonicteric.  Neck: Supple, no masses.  No JVD.  Pulmonary:  Good air movement, no use of accessory muscles.  Cardiac: RRR Vascular:  Vessel Right Left  Radial Palpable Palpable  Dorsalis Pedis Palpable Palpable  Posterior  Tibial Palpable Palpable   Gastrointestinal: soft, non-distended. No guarding/no peritoneal signs.  Musculoskeletal: M/S 5/5 throughout.  No deformity or atrophy.  Neurologic: Pain and light touch intact in extremities.  Symmetrical.  Speech is fluent. Motor exam as listed above. Psychiatric: Judgment intact, Mood & affect appropriate for pt's clinical situation. Dermatologic: No Venous  rashes. No Ulcers Noted.  No changes consistent with cellulitis. Lymph : No Cervical lymphadenopathy, no lichenification or skin changes of chronic lymphedema.       ASSESSMENT AND PLAN:  1. Peripheral artery disease ([place]) Recommend:  The patient is status post successful angiogram with intervention.  The patient reports that the claudication symptoms and leg pain is essentially gone.   The patient denies lifestyle limiting changes at this point in time.  No further invasive studies, angiography or surgery at this time The patient should continue walking and begin a more formal exercise program.  The patient should continue antiplatelet therapy and aggressive treatment of the lipid abnormalities  Smoking cessation was again discussed  The patient should continue wearing graduated compression socks 10-15 mmHg strength to control the mild edema.  Patient should undergo noninvasive studies as ordered. The patient will follow up with me after the studies.   - VAS [place] ABI WITH/WO TBI; Future - VAS [place] LOWER EXTREMITY ARTERIAL DUPLEX; Future  2. Tobacco abuse Smoking cessation was discussed, 3-10 minutes spent on this topic specifically   3. Panlobular emphysema ([place]) Continue pulmonary medications and aerosols as already ordered, these medications have been reviewed and there are no changes at this time.     Current Outpatient Medications on File Prior to Visit  Medication Sig Dispense Refill  . albuterol (VENTOLIN HFA) 108 (90 Base) MCG/ACT inhaler Inhale 1-2 puffs into the lungs every 6 (six)  hours as needed for shortness of breath (use when you have trouble breathing.). 1 Inhaler 5  . aspirin EC 81 MG tablet Take 1 tablet (81 mg total) by mouth daily. 150 tablet 2  . clopidogrel (PLAVIX) 75 MG tablet TAKE 1 TABLET EVERY DAY 90 tablet 3   No current facility-administered medications on file prior to visit.     There are no Patient Instructions on file for this visit. Return in about 1 year (around 12/10/2019) for PAD.   Kris Hartmann, NP  This note was completed with Sales executive.  Any errors are purely unintentional.

## 2019-02-19 DIAGNOSIS — L821 Other seborrheic keratosis: Secondary | ICD-10-CM | POA: Diagnosis not present

## 2019-02-19 DIAGNOSIS — Z85820 Personal history of malignant melanoma of skin: Secondary | ICD-10-CM | POA: Diagnosis not present

## 2019-02-19 DIAGNOSIS — C4442 Squamous cell carcinoma of skin of scalp and neck: Secondary | ICD-10-CM | POA: Diagnosis not present

## 2019-02-19 DIAGNOSIS — C44622 Squamous cell carcinoma of skin of right upper limb, including shoulder: Secondary | ICD-10-CM | POA: Diagnosis not present

## 2019-02-19 DIAGNOSIS — L578 Other skin changes due to chronic exposure to nonionizing radiation: Secondary | ICD-10-CM | POA: Diagnosis not present

## 2019-02-19 DIAGNOSIS — L570 Actinic keratosis: Secondary | ICD-10-CM | POA: Diagnosis not present

## 2019-04-15 ENCOUNTER — Other Ambulatory Visit (INDEPENDENT_AMBULATORY_CARE_PROVIDER_SITE_OTHER): Payer: Self-pay | Admitting: Vascular Surgery

## 2019-05-28 DIAGNOSIS — Z125 Encounter for screening for malignant neoplasm of prostate: Secondary | ICD-10-CM | POA: Diagnosis not present

## 2019-05-28 DIAGNOSIS — Z79899 Other long term (current) drug therapy: Secondary | ICD-10-CM | POA: Diagnosis not present

## 2019-05-28 DIAGNOSIS — E538 Deficiency of other specified B group vitamins: Secondary | ICD-10-CM | POA: Diagnosis not present

## 2019-05-31 DIAGNOSIS — I10 Essential (primary) hypertension: Secondary | ICD-10-CM | POA: Insufficient documentation

## 2019-05-31 DIAGNOSIS — J449 Chronic obstructive pulmonary disease, unspecified: Secondary | ICD-10-CM | POA: Diagnosis not present

## 2019-05-31 DIAGNOSIS — Z0000 Encounter for general adult medical examination without abnormal findings: Secondary | ICD-10-CM | POA: Diagnosis not present

## 2019-05-31 DIAGNOSIS — I739 Peripheral vascular disease, unspecified: Secondary | ICD-10-CM | POA: Diagnosis not present

## 2019-09-22 DIAGNOSIS — B349 Viral infection, unspecified: Secondary | ICD-10-CM | POA: Diagnosis not present

## 2019-09-22 DIAGNOSIS — K922 Gastrointestinal hemorrhage, unspecified: Secondary | ICD-10-CM | POA: Diagnosis not present

## 2019-09-22 DIAGNOSIS — J441 Chronic obstructive pulmonary disease with (acute) exacerbation: Secondary | ICD-10-CM | POA: Diagnosis not present

## 2019-09-27 DIAGNOSIS — K922 Gastrointestinal hemorrhage, unspecified: Secondary | ICD-10-CM | POA: Diagnosis not present

## 2019-09-27 DIAGNOSIS — J441 Chronic obstructive pulmonary disease with (acute) exacerbation: Secondary | ICD-10-CM | POA: Diagnosis not present

## 2019-11-19 ENCOUNTER — Other Ambulatory Visit: Payer: Self-pay

## 2019-11-19 DIAGNOSIS — R918 Other nonspecific abnormal finding of lung field: Secondary | ICD-10-CM

## 2019-12-06 DIAGNOSIS — Z20828 Contact with and (suspected) exposure to other viral communicable diseases: Secondary | ICD-10-CM | POA: Diagnosis not present

## 2019-12-17 ENCOUNTER — Encounter (INDEPENDENT_AMBULATORY_CARE_PROVIDER_SITE_OTHER): Payer: Self-pay | Admitting: Nurse Practitioner

## 2019-12-17 ENCOUNTER — Other Ambulatory Visit: Payer: Self-pay

## 2019-12-17 ENCOUNTER — Ambulatory Visit (INDEPENDENT_AMBULATORY_CARE_PROVIDER_SITE_OTHER): Payer: Medicare HMO | Admitting: Nurse Practitioner

## 2019-12-17 ENCOUNTER — Ambulatory Visit (INDEPENDENT_AMBULATORY_CARE_PROVIDER_SITE_OTHER): Payer: Medicare HMO

## 2019-12-17 VITALS — BP 130/67 | HR 73 | Resp 16 | Wt 146.8 lb

## 2019-12-17 DIAGNOSIS — I70212 Atherosclerosis of native arteries of extremities with intermittent claudication, left leg: Secondary | ICD-10-CM | POA: Diagnosis not present

## 2019-12-17 DIAGNOSIS — I10 Essential (primary) hypertension: Secondary | ICD-10-CM

## 2019-12-17 DIAGNOSIS — J431 Panlobular emphysema: Secondary | ICD-10-CM | POA: Diagnosis not present

## 2019-12-17 DIAGNOSIS — I739 Peripheral vascular disease, unspecified: Secondary | ICD-10-CM

## 2019-12-17 DIAGNOSIS — Z720 Tobacco use: Secondary | ICD-10-CM | POA: Diagnosis not present

## 2019-12-20 ENCOUNTER — Encounter (INDEPENDENT_AMBULATORY_CARE_PROVIDER_SITE_OTHER): Payer: Self-pay | Admitting: Nurse Practitioner

## 2019-12-20 NOTE — Progress Notes (Signed)
Subjective:    Patient ID: Derek Henderson, male    DOB: Mar 14, 1946, 74 y.o.   MRN: [id number] Chief Complaint  Patient presents with  . Follow-up    ultrasound follow up    The patient returns to the office for followup and review of the noninvasive studies. There have been no interval changes in lower extremity symptoms. No interval shortening of the patient's claudication distance or development of rest pain symptoms. No new ulcers or wounds have occurred since the last visit.  There have been no significant changes to the patient's overall health care.  The patient denies amaurosis fugax or recent TIA symptoms. There are no recent neurological changes noted. The patient denies history of DVT, PE or superficial thrombophlebitis. The patient denies recent episodes of angina or shortness of breath.   ABI Rt=1.08 and Lt=1.06  (previous ABI's Rt=1.15 and Lt=1.08) Duplex ultrasound of the bilateral tibial arteries reveals triphasic waveforms with good toe waveforms bilaterally.   Review of Systems  Cardiovascular: Negative for leg swelling.  All other systems reviewed and are negative.      Objective:   Physical Exam Vitals reviewed.  HENT:     Head: Normocephalic.  Cardiovascular:     Rate and Rhythm: Normal rate and regular rhythm.     Pulses: Normal pulses.  Pulmonary:     Effort: Pulmonary effort is normal.     Breath sounds: Normal breath sounds.  Musculoskeletal:        General: Normal range of motion.  Skin:    General: Skin is warm and dry.  Neurological:     Mental Status: He is alert and oriented to person, place, and time.  Psychiatric:        Mood and Affect: Mood normal.        Behavior: Behavior normal.        Thought Content: Thought content normal.        Judgment: Judgment normal.     BP 130/67 (BP Location: Right Arm)   Pulse 73   Resp 16   Wt 146 lb 12.8 oz (66.6 kg)   BMI 19.37 kg/m   Past Medical History:  Diagnosis Date  . Cancer  ([place])    melanoma  . COPD (chronic obstructive pulmonary disease) ([place])   . Emphysema lung ([place])   . History of malignant melanoma   . Mild obstructive sleep apnea    can not afford CPAP machine  . PAD (peripheral artery disease) ([place])   . Panlobular emphysema ([place])   . Tobacco abuse     Social History   Socioeconomic History  . Marital status: Married    Spouse name: Not on file  . Number of children: Not on file  . Years of education: Not on file  . Highest education level: Not on file  Occupational History  . Not on file  Tobacco Use  . Smoking status: Current Every Day Smoker    Packs/day: 1.00    Years: 45.00    Pack years: 45.00  . Smokeless tobacco: Current User    Types: Chew  Vaping Use  . Vaping Use: Never used  Substance and Sexual Activity  . Alcohol use: Yes    Comment: rare  . Drug use: No  . Sexual activity: Not on file  Other Topics Concern  . Not on file  Social History Narrative  . Not on file   Social Determinants of Health   Financial Resource Strain:   .  Difficulty of Paying Living Expenses: Not on file  Food Insecurity:   . Worried About Charity fundraiser in the Last Year: Not on file  . Ran Out of Food in the Last Year: Not on file  Transportation Needs:   . Lack of Transportation (Medical): Not on file  . Lack of Transportation (Non-Medical): Not on file  Physical Activity:   . Days of Exercise per Week: Not on file  . Minutes of Exercise per Session: Not on file  Stress:   . Feeling of Stress : Not on file  Social Connections:   . Frequency of Communication with Friends and Family: Not on file  . Frequency of Social Gatherings with Friends and Family: Not on file  . Attends Religious Services: Not on file  . Active Member of Clubs or Organizations: Not on file  . Attends Archivist Meetings: Not on file  . Marital Status: Not on file  Intimate Partner Violence:   . Fear of Current or Ex-Partner: Not on file  .  Emotionally Abused: Not on file  . Physically Abused: Not on file  . Sexually Abused: Not on file    Past Surgical History:  Procedure Laterality Date  . HERNIA REPAIR Left   . LOWER EXTREMITY ANGIOGRAPHY Left 02/11/2017   Procedure: Lower Extremity Angiography;  Surgeon: Katha Cabal, MD;  Location: [facility];  Service: Cardiovascular;  Laterality: Left;  . LOWER EXTREMITY INTERVENTION  02/11/2017   Procedure: LOWER EXTREMITY INTERVENTION;  Surgeon: Katha Cabal, MD;  Location: [facility];  Service: Cardiovascular;;  . MELANOMA EXCISION Right 2010    Family History  Problem Relation Age of Onset  . Cancer Mother        lung and breast  . Tuberculosis Mother   . Cancer Father        throat  . Cancer Sister        stomach  . Cancer Brother        Brain   . Cancer Sister        lung  . Cancer Brother        lung    Allergies  Allergen Reactions  . Codeine Other (See Comments)    Headache        Assessment & Plan:   1. Atherosclerosis of native artery of left lower extremity with intermittent claudication (HCC)  Recommend:  The patient has evidence of atherosclerosis of the lower extremities with claudication.  The patient does not voice lifestyle limiting changes at this point in time.  Noninvasive studies do not suggest clinically significant change.  No invasive studies, angiography or surgery at this time The patient should continue walking and begin a more formal exercise program.  The patient should continue antiplatelet therapy and aggressive treatment of the lipid abnormalities  No changes in the patient's medications at this time  The patient should continue wearing graduated compression socks 10-15 mmHg strength to control the mild edema.    2. Benign essential hypertension Continue antihypertensive medications as already ordered, these medications have been reviewed and there are no changes at this time.   3.  Tobacco abuse Smoking cessation was discussed, 3-10 minutes spent on this topic specifically   4. Panlobular emphysema ([place]) Continue pulmonary medications and aerosols as already ordered, these medications have been reviewed and there are no changes at this time.     Current Outpatient Medications on File Prior to Visit  Medication Sig  Dispense Refill  . albuterol (VENTOLIN HFA) 108 (90 Base) MCG/ACT inhaler Inhale 1-2 puffs into the lungs every 6 (six) hours as needed for shortness of breath (use when you have trouble breathing.). 1 g 3  . aspirin EC 81 MG tablet Take 1 tablet (81 mg total) by mouth daily. 150 tablet 2  . clopidogrel (PLAVIX) 75 MG tablet TAKE 1 TABLET EVERY DAY 90 tablet 3   No current facility-administered medications on file prior to visit.    There are no Patient Instructions on file for this visit. No follow-ups on file.   Kris Hartmann, NP

## 2020-01-13 ENCOUNTER — Telehealth: Payer: Self-pay | Admitting: Internal Medicine

## 2020-01-13 NOTE — Telephone Encounter (Signed)
I finally spoke with Derek Henderson today and he stated that he was cancer free and if he needed another CT his PCP would refer him to Pulmonary again at that time. [name] believes in a higher power then doctors.  Patient last seen Dr. Ashby Dawes 12/10/2018 and his last CT was done 12/04/2018

## 2020-01-17 NOTE — Telephone Encounter (Signed)
Patient will need to be re-assessed for CT needs OV with any provider at this time

## 2020-01-17 NOTE — Telephone Encounter (Signed)
Lm for patient.  

## 2020-01-17 NOTE — Telephone Encounter (Signed)
Lesleigh Noe can you see if you can help with this patient per Dr. Mortimer Fries

## 2020-01-18 NOTE — Telephone Encounter (Signed)
Lm x2 for patient.   

## 2020-01-20 NOTE — Telephone Encounter (Signed)
Spoke to patient's spouse, Derek Henderson(DPR). Who stated that patient does not wish to schedule an appointment with our office as he does not feel that he needs to see a doctor.  Will close encounter.   Routing to Dr. Mortimer Fries as an Juluis Rainier.

## 2020-05-24 DIAGNOSIS — Z125 Encounter for screening for malignant neoplasm of prostate: Secondary | ICD-10-CM | POA: Diagnosis not present

## 2020-05-24 DIAGNOSIS — Z79899 Other long term (current) drug therapy: Secondary | ICD-10-CM | POA: Diagnosis not present

## 2020-05-24 DIAGNOSIS — I739 Peripheral vascular disease, unspecified: Secondary | ICD-10-CM | POA: Diagnosis not present

## 2020-05-31 DIAGNOSIS — J449 Chronic obstructive pulmonary disease, unspecified: Secondary | ICD-10-CM | POA: Diagnosis not present

## 2020-05-31 DIAGNOSIS — Z0000 Encounter for general adult medical examination without abnormal findings: Secondary | ICD-10-CM | POA: Diagnosis not present

## 2020-05-31 DIAGNOSIS — I739 Peripheral vascular disease, unspecified: Secondary | ICD-10-CM | POA: Diagnosis not present

## 2020-05-31 DIAGNOSIS — Z23 Encounter for immunization: Secondary | ICD-10-CM | POA: Diagnosis not present

## 2020-05-31 DIAGNOSIS — Z125 Encounter for screening for malignant neoplasm of prostate: Secondary | ICD-10-CM | POA: Diagnosis not present

## 2020-05-31 DIAGNOSIS — J90 Pleural effusion, not elsewhere classified: Secondary | ICD-10-CM | POA: Diagnosis not present

## 2020-05-31 DIAGNOSIS — J431 Panlobular emphysema: Secondary | ICD-10-CM | POA: Diagnosis not present

## 2020-06-13 DIAGNOSIS — Z85820 Personal history of malignant melanoma of skin: Secondary | ICD-10-CM | POA: Diagnosis not present

## 2020-06-13 DIAGNOSIS — C4441 Basal cell carcinoma of skin of scalp and neck: Secondary | ICD-10-CM | POA: Diagnosis not present

## 2020-06-13 DIAGNOSIS — D485 Neoplasm of uncertain behavior of skin: Secondary | ICD-10-CM | POA: Diagnosis not present

## 2020-06-13 DIAGNOSIS — D2239 Melanocytic nevi of other parts of face: Secondary | ICD-10-CM | POA: Diagnosis not present

## 2020-06-13 DIAGNOSIS — L821 Other seborrheic keratosis: Secondary | ICD-10-CM | POA: Diagnosis not present

## 2020-06-13 DIAGNOSIS — L720 Epidermal cyst: Secondary | ICD-10-CM | POA: Diagnosis not present

## 2020-08-24 DIAGNOSIS — L728 Other follicular cysts of the skin and subcutaneous tissue: Secondary | ICD-10-CM | POA: Diagnosis not present

## 2020-08-24 DIAGNOSIS — C44629 Squamous cell carcinoma of skin of left upper limb, including shoulder: Secondary | ICD-10-CM | POA: Diagnosis not present

## 2020-08-24 DIAGNOSIS — L821 Other seborrheic keratosis: Secondary | ICD-10-CM | POA: Diagnosis not present

## 2020-08-24 DIAGNOSIS — C44319 Basal cell carcinoma of skin of other parts of face: Secondary | ICD-10-CM | POA: Diagnosis not present

## 2020-08-24 DIAGNOSIS — L578 Other skin changes due to chronic exposure to nonionizing radiation: Secondary | ICD-10-CM | POA: Diagnosis not present

## 2020-09-05 DIAGNOSIS — C44629 Squamous cell carcinoma of skin of left upper limb, including shoulder: Secondary | ICD-10-CM | POA: Diagnosis not present

## 2020-11-02 DIAGNOSIS — L578 Other skin changes due to chronic exposure to nonionizing radiation: Secondary | ICD-10-CM | POA: Diagnosis not present

## 2020-11-02 DIAGNOSIS — L570 Actinic keratosis: Secondary | ICD-10-CM | POA: Diagnosis not present

## 2020-11-02 DIAGNOSIS — C44622 Squamous cell carcinoma of skin of right upper limb, including shoulder: Secondary | ICD-10-CM | POA: Diagnosis not present

## 2020-11-02 DIAGNOSIS — L821 Other seborrheic keratosis: Secondary | ICD-10-CM | POA: Diagnosis not present

## 2020-12-13 ENCOUNTER — Other Ambulatory Visit (INDEPENDENT_AMBULATORY_CARE_PROVIDER_SITE_OTHER): Payer: Self-pay | Admitting: Nurse Practitioner

## 2020-12-13 DIAGNOSIS — I70212 Atherosclerosis of native arteries of extremities with intermittent claudication, left leg: Secondary | ICD-10-CM

## 2020-12-14 ENCOUNTER — Ambulatory Visit (INDEPENDENT_AMBULATORY_CARE_PROVIDER_SITE_OTHER): Payer: Medicare HMO | Admitting: Vascular Surgery

## 2020-12-14 ENCOUNTER — Ambulatory Visit (INDEPENDENT_AMBULATORY_CARE_PROVIDER_SITE_OTHER): Payer: Medicare HMO

## 2020-12-14 ENCOUNTER — Other Ambulatory Visit: Payer: Self-pay

## 2020-12-14 DIAGNOSIS — I70212 Atherosclerosis of native arteries of extremities with intermittent claudication, left leg: Secondary | ICD-10-CM | POA: Diagnosis not present

## 2020-12-27 ENCOUNTER — Encounter (INDEPENDENT_AMBULATORY_CARE_PROVIDER_SITE_OTHER): Payer: Self-pay | Admitting: *Deleted

## 2021-01-09 ENCOUNTER — Emergency Department (HOSPITAL_COMMUNITY): Payer: Medicare HMO

## 2021-01-09 ENCOUNTER — Inpatient Hospital Stay (HOSPITAL_COMMUNITY)
Admission: EM | Admit: 2021-01-09 | Discharge: 2021-01-16 | DRG: 964 | Disposition: A | Discharge status: Home Health | Payer: Medicare HMO | Attending: General Surgery | Admitting: General Surgery

## 2021-01-09 ENCOUNTER — Inpatient Hospital Stay (HOSPITAL_COMMUNITY): Payer: Medicare HMO

## 2021-01-09 DIAGNOSIS — R52 Pain, unspecified: Secondary | ICD-10-CM

## 2021-01-09 DIAGNOSIS — S92014A Nondisplaced fracture of body of right calcaneus, initial encounter for closed fracture: Secondary | ICD-10-CM

## 2021-01-09 DIAGNOSIS — S129XXA Fracture of neck, unspecified, initial encounter: Secondary | ICD-10-CM

## 2021-01-09 DIAGNOSIS — R0902 Hypoxemia: Secondary | ICD-10-CM

## 2021-01-09 DIAGNOSIS — S065X9A Traumatic subdural hemorrhage with loss of consciousness of unspecified duration, initial encounter: Secondary | ICD-10-CM | POA: Diagnosis present

## 2021-01-09 DIAGNOSIS — S0292XA Unspecified fracture of facial bones, initial encounter for closed fracture: Secondary | ICD-10-CM

## 2021-01-09 DIAGNOSIS — S065XAA Traumatic subdural hemorrhage with loss of consciousness status unknown, initial encounter: Principal | ICD-10-CM

## 2021-01-09 DIAGNOSIS — J942 Hemothorax: Secondary | ICD-10-CM

## 2021-01-09 DIAGNOSIS — S2243XA Multiple fractures of ribs, bilateral, initial encounter for closed fracture: Secondary | ICD-10-CM

## 2021-01-09 DIAGNOSIS — N2889 Other specified disorders of kidney and ureter: Secondary | ICD-10-CM | POA: Diagnosis present

## 2021-01-09 DIAGNOSIS — Z23 Encounter for immunization: Secondary | ICD-10-CM | POA: Diagnosis not present

## 2021-01-09 DIAGNOSIS — R4182 Altered mental status, unspecified: Secondary | ICD-10-CM | POA: Diagnosis present

## 2021-01-09 DIAGNOSIS — S271XXA Traumatic hemothorax, initial encounter: Secondary | ICD-10-CM | POA: Diagnosis not present

## 2021-01-09 DIAGNOSIS — I700 Atherosclerosis of aorta: Secondary | ICD-10-CM | POA: Diagnosis not present

## 2021-01-09 DIAGNOSIS — S22028A Other fracture of second thoracic vertebra, initial encounter for closed fracture: Secondary | ICD-10-CM | POA: Diagnosis present

## 2021-01-09 DIAGNOSIS — S12601A Unspecified nondisplaced fracture of seventh cervical vertebra, initial encounter for closed fracture: Secondary | ICD-10-CM | POA: Diagnosis not present

## 2021-01-09 DIAGNOSIS — S0240FA Zygomatic fracture, left side, initial encounter for closed fracture: Secondary | ICD-10-CM | POA: Diagnosis not present

## 2021-01-09 DIAGNOSIS — S12690A Other displaced fracture of seventh cervical vertebra, initial encounter for closed fracture: Secondary | ICD-10-CM | POA: Diagnosis present

## 2021-01-09 DIAGNOSIS — Z20822 Contact with and (suspected) exposure to covid-19: Secondary | ICD-10-CM | POA: Diagnosis present

## 2021-01-09 DIAGNOSIS — F17210 Nicotine dependence, cigarettes, uncomplicated: Secondary | ICD-10-CM | POA: Diagnosis present

## 2021-01-09 DIAGNOSIS — Y92413 State road as the place of occurrence of the external cause: Secondary | ICD-10-CM | POA: Diagnosis not present

## 2021-01-09 DIAGNOSIS — S0003XA Contusion of scalp, initial encounter: Secondary | ICD-10-CM | POA: Diagnosis not present

## 2021-01-09 DIAGNOSIS — J939 Pneumothorax, unspecified: Secondary | ICD-10-CM | POA: Diagnosis not present

## 2021-01-09 DIAGNOSIS — S065X0A Traumatic subdural hemorrhage without loss of consciousness, initial encounter: Principal | ICD-10-CM | POA: Diagnosis present

## 2021-01-09 DIAGNOSIS — S22018A Other fracture of first thoracic vertebra, initial encounter for closed fracture: Secondary | ICD-10-CM | POA: Diagnosis not present

## 2021-01-09 DIAGNOSIS — S3993XA Unspecified injury of pelvis, initial encounter: Secondary | ICD-10-CM | POA: Diagnosis not present

## 2021-01-09 DIAGNOSIS — S92001A Unspecified fracture of right calcaneus, initial encounter for closed fracture: Secondary | ICD-10-CM | POA: Diagnosis present

## 2021-01-09 DIAGNOSIS — J449 Chronic obstructive pulmonary disease, unspecified: Secondary | ICD-10-CM | POA: Diagnosis not present

## 2021-01-09 DIAGNOSIS — I739 Peripheral vascular disease, unspecified: Secondary | ICD-10-CM | POA: Diagnosis present

## 2021-01-09 DIAGNOSIS — S12500A Unspecified displaced fracture of sixth cervical vertebra, initial encounter for closed fracture: Secondary | ICD-10-CM | POA: Diagnosis not present

## 2021-01-09 DIAGNOSIS — Z7902 Long term (current) use of antithrombotics/antiplatelets: Secondary | ICD-10-CM

## 2021-01-09 DIAGNOSIS — M549 Dorsalgia, unspecified: Secondary | ICD-10-CM | POA: Diagnosis not present

## 2021-01-09 DIAGNOSIS — R079 Chest pain, unspecified: Secondary | ICD-10-CM | POA: Diagnosis not present

## 2021-01-09 DIAGNOSIS — T07XXXA Unspecified multiple injuries, initial encounter: Secondary | ICD-10-CM | POA: Diagnosis not present

## 2021-01-09 DIAGNOSIS — J439 Emphysema, unspecified: Secondary | ICD-10-CM | POA: Diagnosis not present

## 2021-01-09 DIAGNOSIS — Z79899 Other long term (current) drug therapy: Secondary | ICD-10-CM

## 2021-01-09 DIAGNOSIS — S0232XA Fracture of orbital floor, left side, initial encounter for closed fracture: Secondary | ICD-10-CM | POA: Diagnosis present

## 2021-01-09 DIAGNOSIS — R4020 Unspecified coma: Secondary | ICD-10-CM | POA: Diagnosis not present

## 2021-01-09 DIAGNOSIS — S12590A Other displaced fracture of sixth cervical vertebra, initial encounter for closed fracture: Secondary | ICD-10-CM | POA: Diagnosis not present

## 2021-01-09 DIAGNOSIS — S0240DA Maxillary fracture, left side, initial encounter for closed fracture: Secondary | ICD-10-CM | POA: Diagnosis not present

## 2021-01-09 DIAGNOSIS — S300XXA Contusion of lower back and pelvis, initial encounter: Secondary | ICD-10-CM | POA: Diagnosis present

## 2021-01-09 DIAGNOSIS — S2242XA Multiple fractures of ribs, left side, initial encounter for closed fracture: Secondary | ICD-10-CM | POA: Diagnosis not present

## 2021-01-09 DIAGNOSIS — S3991XA Unspecified injury of abdomen, initial encounter: Secondary | ICD-10-CM | POA: Diagnosis not present

## 2021-01-09 DIAGNOSIS — S22019A Unspecified fracture of first thoracic vertebra, initial encounter for closed fracture: Secondary | ICD-10-CM | POA: Diagnosis not present

## 2021-01-09 DIAGNOSIS — R059 Cough, unspecified: Secondary | ICD-10-CM | POA: Diagnosis not present

## 2021-01-09 DIAGNOSIS — S2249XA Multiple fractures of ribs, unspecified side, initial encounter for closed fracture: Secondary | ICD-10-CM | POA: Diagnosis not present

## 2021-01-09 DIAGNOSIS — S22020A Wedge compression fracture of second thoracic vertebra, initial encounter for closed fracture: Secondary | ICD-10-CM | POA: Diagnosis not present

## 2021-01-09 DIAGNOSIS — R0789 Other chest pain: Secondary | ICD-10-CM | POA: Diagnosis not present

## 2021-01-09 DIAGNOSIS — R918 Other nonspecific abnormal finding of lung field: Secondary | ICD-10-CM | POA: Diagnosis not present

## 2021-01-09 DIAGNOSIS — M7989 Other specified soft tissue disorders: Secondary | ICD-10-CM | POA: Diagnosis not present

## 2021-01-09 DIAGNOSIS — S1093XA Contusion of unspecified part of neck, initial encounter: Secondary | ICD-10-CM | POA: Diagnosis not present

## 2021-01-09 LAB — CBC
HCT: 46.2 % (ref 39.0–52.0)
Hemoglobin: 15.8 g/dL (ref 13.0–17.0)
MCH: 32.2 pg (ref 26.0–34.0)
MCHC: 34.2 g/dL (ref 30.0–36.0)
MCV: 94.1 fL (ref 80.0–100.0)
Platelets: 284 10*3/uL (ref 150–400)
RBC: 4.91 MIL/uL (ref 4.22–5.81)
RDW: 12.8 % (ref 11.5–15.5)
WBC: 11.4 10*3/uL — ABNORMAL HIGH (ref 4.0–10.5)
nRBC: 0 % (ref 0.0–0.2)

## 2021-01-09 LAB — COMPREHENSIVE METABOLIC PANEL
ALT: 21 U/L (ref 0–44)
AST: 29 U/L (ref 15–41)
Albumin: 3.8 g/dL (ref 3.5–5.0)
Alkaline Phosphatase: 59 U/L (ref 38–126)
Anion gap: 7 (ref 5–15)
BUN: 15 mg/dL (ref 8–23)
CO2: 26 mmol/L (ref 22–32)
Calcium: 10.1 mg/dL (ref 8.9–10.3)
Chloride: 103 mmol/L (ref 98–111)
Creatinine, Ser: 1 mg/dL (ref 0.61–1.24)
GFR, Estimated: >60 mL/min (ref >60)
Glucose, Bld: 107 mg/dL — ABNORMAL HIGH (ref 70–99)
Potassium: 4.3 mmol/L (ref 3.5–5.1)
Sodium: 136 mmol/L (ref 135–145)
Total Bilirubin: 1 mg/dL (ref 0.3–1.2)
Total Protein: 6.1 g/dL — ABNORMAL LOW (ref 6.5–8.1)

## 2021-01-09 LAB — URINALYSIS, ROUTINE W REFLEX MICROSCOPIC
Bacteria, UA: NONE SEEN
Bilirubin Urine: NEGATIVE
Glucose, UA: NEGATIVE mg/dL
Ketones, ur: 5 mg/dL — AB
Leukocytes,Ua: NEGATIVE
Nitrite: NEGATIVE
Protein, ur: NEGATIVE mg/dL
Specific Gravity, Urine: 1.025 (ref 1.005–1.030)
pH: 5 (ref 5.0–8.0)

## 2021-01-09 LAB — RESP PANEL BY RT-PCR (FLU A&B, COVID) ARPGX2
Influenza A by PCR: NEGATIVE
Influenza B by PCR: NEGATIVE
SARS Coronavirus 2 by RT PCR: NEGATIVE

## 2021-01-09 LAB — I-STAT CHEM 8, ED
BUN: 19 mg/dL (ref 8–23)
Calcium, Ion: 1.24 mmol/L (ref 1.15–1.40)
Chloride: 103 mmol/L (ref 98–111)
Creatinine, Ser: 0.9 mg/dL (ref 0.61–1.24)
Glucose, Bld: 105 mg/dL — ABNORMAL HIGH (ref 70–99)
HCT: 46 % (ref 39.0–52.0)
Hemoglobin: 15.6 g/dL (ref 13.0–17.0)
Potassium: 4.1 mmol/L (ref 3.5–5.1)
Sodium: 137 mmol/L (ref 135–145)
TCO2: 28 mmol/L (ref 22–32)

## 2021-01-09 LAB — ETHANOL: Alcohol, Ethyl (B): <10 mg/dL (ref <10)

## 2021-01-09 LAB — MRSA NEXT GEN BY PCR, NASAL: MRSA by PCR Next Gen: NOT DETECTED

## 2021-01-09 LAB — PROTIME-INR
INR: 1 (ref 0.8–1.2)
Prothrombin Time: 13.6 seconds (ref 11.4–15.2)

## 2021-01-09 LAB — LACTIC ACID, PLASMA: Lactic Acid, Venous: 1.3 mmol/L (ref 0.5–1.9)

## 2021-01-09 LAB — SAMPLE TO BLOOD BANK

## 2021-01-09 MED ORDER — FENTANYL CITRATE PF 50 MCG/ML IJ SOSY
50.0000 ug | PREFILLED_SYRINGE | Freq: Once | INTRAMUSCULAR | Status: AC
  Administered 2021-01-09: 50 ug via INTRAVENOUS
  Filled 2021-01-09: qty 1

## 2021-01-09 MED ORDER — ALBUTEROL SULFATE (2.5 MG/3ML) 0.083% IN NEBU
3.0000 mL | INHALATION_SOLUTION | Freq: Two times a day (BID) | RESPIRATORY_TRACT | Status: DC
  Administered 2021-01-10 – 2021-01-11 (×4): 3 mL via RESPIRATORY_TRACT
  Filled 2021-01-09 (×5): qty 3

## 2021-01-09 MED ORDER — POTASSIUM CHLORIDE IN NACL 20-0.9 MEQ/L-% IV SOLN
INTRAVENOUS | Status: DC
  Filled 2021-01-09 (×2): qty 1000

## 2021-01-09 MED ORDER — ALBUTEROL SULFATE (2.5 MG/3ML) 0.083% IN NEBU
3.0000 mL | INHALATION_SOLUTION | Freq: Four times a day (QID) | RESPIRATORY_TRACT | Status: DC
  Administered 2021-01-09: 3 mL via RESPIRATORY_TRACT
  Filled 2021-01-09: qty 3

## 2021-01-09 MED ORDER — ACETAMINOPHEN 325 MG PO TABS
650.0000 mg | ORAL_TABLET | ORAL | Status: DC | PRN
Start: 2021-01-09 — End: 2021-01-14
  Administered 2021-01-10 – 2021-01-13 (×7): 650 mg via ORAL
  Filled 2021-01-09 (×7): qty 2

## 2021-01-09 MED ORDER — MORPHINE SULFATE (PF) 4 MG/ML IV SOLN
4.0000 mg | INTRAVENOUS | Status: DC | PRN
  Administered 2021-01-09 – 2021-01-10 (×4): 4 mg via INTRAVENOUS
  Filled 2021-01-09 (×4): qty 1

## 2021-01-09 MED ORDER — METOPROLOL TARTRATE 5 MG/5ML IV SOLN: 5.0000 mg | Freq: Four times a day (QID) | INTRAVENOUS | Status: DC | PRN

## 2021-01-09 MED ORDER — ONDANSETRON 4 MG PO TBDP: 4.0000 mg | ORAL_TABLET | Freq: Four times a day (QID) | ORAL | Status: DC | PRN

## 2021-01-09 MED ORDER — TRAMADOL HCL 50 MG PO TABS
50.0000 mg | ORAL_TABLET | Freq: Four times a day (QID) | ORAL | Status: DC | PRN
  Administered 2021-01-09 – 2021-01-10 (×4): 50 mg via ORAL
  Filled 2021-01-09 (×4): qty 1

## 2021-01-09 MED ORDER — TETANUS-DIPHTH-ACELL PERTUSSIS 5-2.5-18.5 LF-MCG/0.5 IM SUSY
0.5000 mL | PREFILLED_SYRINGE | Freq: Once | INTRAMUSCULAR | Status: AC
  Administered 2021-01-09: 0.5 mL via INTRAMUSCULAR

## 2021-01-09 MED ORDER — LEVETIRACETAM IN NACL 500 MG/100ML IV SOLN
500.0000 mg | Freq: Two times a day (BID) | INTRAVENOUS | Status: DC
Start: 2021-01-09 — End: 2021-01-10
  Administered 2021-01-09 – 2021-01-10 (×2): 500 mg via INTRAVENOUS
  Filled 2021-01-09 (×3): qty 100

## 2021-01-09 MED ORDER — IOHEXOL 350 MG/ML SOLN
80.0000 mL | Freq: Once | INTRAVENOUS | Status: AC | PRN
  Administered 2021-01-09: 80 mL via INTRAVENOUS

## 2021-01-09 MED ORDER — ONDANSETRON HCL 4 MG/2ML IJ SOLN: 4.0000 mg | Freq: Four times a day (QID) | INTRAMUSCULAR | Status: DC | PRN

## 2021-01-09 NOTE — ED Notes (Signed)
Patient transported to CT 
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
## 2021-01-09 NOTE — ED Notes (Signed)
Ortho tech to bedside. 

## 2021-01-09 NOTE — Progress Notes (Signed)
Orthopedic Tech Progress Note Patient Details:  [name] May 06, 1945 [id number]  Ortho Devices Type of Ortho Device: Doran Durand splint, Cotton web roll Ortho Device/Splint Location: RLE Ortho Device/Splint Interventions: Ordered, Application   Post Interventions Patient Tolerated: Well Instructions Provided: Care of Derek Henderson 01/09/2021, 5:23 PM

## 2021-01-09 NOTE — ED Notes (Signed)
Trauma MD to bedside

## 2021-01-09 NOTE — ED Notes (Signed)
Ortho MD at bedside.

## 2021-01-09 NOTE — Progress Notes (Signed)
Patient ID: Derek Henderson, male   DOB: 12-06-1945, 75 y.o.   MRN: [id number]  Orthopedic surgery aware of pt, will likely need ORIF right calc in delayed fashion 2/2 swelling. Will get CT for operative planning. NWB. Ok to eat from ortho perspective. Full consult note to follow in AM.    Lisette Abu, PA-C Orthopedic Surgery [phone]
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
## 2021-01-10 ENCOUNTER — Inpatient Hospital Stay (HOSPITAL_COMMUNITY): Payer: Medicare HMO

## 2021-01-10 LAB — CBC
HCT: 40.3 % (ref 39.0–52.0)
Hemoglobin: 13.8 g/dL (ref 13.0–17.0)
MCH: 32.2 pg (ref 26.0–34.0)
MCHC: 34.2 g/dL (ref 30.0–36.0)
MCV: 94.2 fL (ref 80.0–100.0)
Platelets: 228 10*3/uL (ref 150–400)
RBC: 4.28 MIL/uL (ref 4.22–5.81)
RDW: 12.9 % (ref 11.5–15.5)
WBC: 11.3 10*3/uL — ABNORMAL HIGH (ref 4.0–10.5)
nRBC: 0 % (ref 0.0–0.2)

## 2021-01-10 LAB — BASIC METABOLIC PANEL
Anion gap: 5 (ref 5–15)
BUN: 19 mg/dL (ref 8–23)
CO2: 26 mmol/L (ref 22–32)
Calcium: 9.5 mg/dL (ref 8.9–10.3)
Chloride: 103 mmol/L (ref 98–111)
Creatinine, Ser: 0.88 mg/dL (ref 0.61–1.24)
GFR, Estimated: >60 mL/min (ref >60)
Glucose, Bld: 124 mg/dL — ABNORMAL HIGH (ref 70–99)
Potassium: 4 mmol/L (ref 3.5–5.1)
Sodium: 134 mmol/L — ABNORMAL LOW (ref 135–145)

## 2021-01-10 MED ORDER — LEVETIRACETAM 500 MG PO TABS
500.0000 mg | ORAL_TABLET | Freq: Two times a day (BID) | ORAL | Status: AC
  Administered 2021-01-10 – 2021-01-15 (×12): 500 mg via ORAL
  Filled 2021-01-10 (×12): qty 1

## 2021-01-10 MED ORDER — CHLORHEXIDINE GLUCONATE CLOTH 2 % EX PADS
6.0000 | MEDICATED_PAD | Freq: Every day | CUTANEOUS | Status: DC
  Administered 2021-01-10 – 2021-01-15 (×5): 6 via TOPICAL
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
## 2021-01-10 NOTE — Progress Notes (Signed)
Patient ID: Derek Henderson, male   DOB: 1945/09/19, 75 y.o.   MRN: [id number] Patient suffers from SDH, C and T spine fractures, Facial fractures, Bilateral rib fractures, Right calcaneal fracture which impairs their ability to perform daily activities like bathing and toileting in the home.  A cane, crutch, or walker will not resolve issue with performing activities of daily living. A wheelchair will allow patient to safely perform daily activities. Patient can safely propel the wheelchair in the home or has a caregiver who can provide assistance. Length of need 6 months . Accessories: elevating leg rests (ELRs), wheel locks, extensions and anti-tippers.  Wellington Hampshire, [facility] 01/10/2021, 4:34 PM Please see Amion for pager number during day hours 7:00am-4:30pm
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
## 2021-01-10 NOTE — Progress Notes (Signed)
OT NOTE  OT evaluation complete and formal evaluation note to follow. Pt currently progressing well and will likely d/c home with family (A) 3n1, wheelchair with elevated leg rest for R LE.   Fleeta Emmer, OTR/L  Acute Rehabilitation Services Pager: [phone] Office: [phone] .
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
## 2021-01-11 LAB — BASIC METABOLIC PANEL
Anion gap: 6 (ref 5–15)
BUN: 12 mg/dL (ref 8–23)
CO2: 29 mmol/L (ref 22–32)
Calcium: 9.4 mg/dL (ref 8.9–10.3)
Chloride: 100 mmol/L (ref 98–111)
Creatinine, Ser: 0.72 mg/dL (ref 0.61–1.24)
GFR, Estimated: >60 mL/min (ref >60)
Glucose, Bld: 110 mg/dL — ABNORMAL HIGH (ref 70–99)
Potassium: 4 mmol/L (ref 3.5–5.1)
Sodium: 135 mmol/L (ref 135–145)

## 2021-01-11 LAB — CBC
HCT: 35.8 % — ABNORMAL LOW (ref 39.0–52.0)
Hemoglobin: 12.2 g/dL — ABNORMAL LOW (ref 13.0–17.0)
MCH: 32.2 pg (ref 26.0–34.0)
MCHC: 34.1 g/dL (ref 30.0–36.0)
MCV: 94.5 fL (ref 80.0–100.0)
Platelets: 176 10*3/uL (ref 150–400)
RBC: 3.79 MIL/uL — ABNORMAL LOW (ref 4.22–5.81)
RDW: 13 % (ref 11.5–15.5)
WBC: 8.4 10*3/uL (ref 4.0–10.5)
nRBC: 0 % (ref 0.0–0.2)

## 2021-01-11 MED ORDER — ENOXAPARIN SODIUM 30 MG/0.3ML IJ SOSY
30.0000 mg | PREFILLED_SYRINGE | Freq: Two times a day (BID) | INTRAMUSCULAR | Status: DC
Start: 2021-01-11 — End: 2021-01-16
  Administered 2021-01-11 – 2021-01-16 (×11): 30 mg via SUBCUTANEOUS
  Filled 2021-01-11 (×11): qty 0.3
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
## 2021-01-11 NOTE — Discharge Instructions (Addendum)
For neck injury: Only need to wear collar when up. Ok to remove while in bed, eating, bathing   RIB FRACTURES  HOME INSTRUCTIONS   PAIN CONTROL:  Pain is best controlled by a usual combination of three different methods TOGETHER:  Ice/Heat Over the counter pain medication Prescription pain medication You may experience some swelling and bruising in area of broken ribs. Ice packs or heating pads (30-60 minutes up to 6 times a day) will help. Use ice for the first few days to help decrease swelling and bruising, then switch to heat to help relax tight/sore spots and speed recovery. Some people prefer to use ice alone, heat alone, alternating between ice & heat. Experiment to what works for you. Swelling and bruising can take several weeks to resolve.  It is helpful to take an over-the-counter pain medication regularly for the first few weeks. Choose one of the following that works best for you:  Naproxen (Aleve, etc) Two 220mg  tabs twice a day Ibuprofen (Advil, etc) Three 200mg  tabs four times a day (every meal & bedtime) Acetaminophen (Tylenol, etc) 500-650mg  four times a day (every meal & bedtime) A prescription for pain medication (such as oxycodone, hydrocodone, etc) may be given to you upon discharge. Take your pain medication as prescribed.  If you are having problems/concerns with the prescription medicine (does not control pain, nausea, vomiting, rash, itching, etc), please call us [phone] to see if we need to switch you to a different pain medicine that will work better for you and/or control your side effect better. If you need a refill on your pain medication, please contact your pharmacy. They will contact our office to request authorization. Prescriptions will not be filled after 5 pm or on week-ends. Avoid getting constipated. When taking pain medications, it is common to experience some constipation. Increasing fluid intake and taking a fiber supplement (such as Metamucil,  Citrucel, FiberCon, MiraLax, etc) 1-2 times a day regularly will usually help prevent this problem from occurring. A mild laxative (prune juice, Milk of Magnesia, MiraLax, etc) should be taken according to package directions if there are no bowel movements after 48 hours.  Watch out for diarrhea. If you have many loose bowel movements, simplify your diet to bland foods & liquids for a few days. Stop any stool softeners and decrease your fiber supplement. Switching to mild anti-diarrheal medications (Kayopectate, Pepto Bismol) can help. If this worsens or does not improve, please call us. FOLLOW UP  If a follow up appointment is needed one will be scheduled for you. If none is needed with our trauma team, please follow up with your primary care provider within 2-3 weeks from discharge. Please call [facility] at [phone] if you have any questions about follow up.  If you have any orthopedic or other injuries you will need to follow up as outlined in your follow up instructions.   WHEN TO CALL us [phone]:  Poor pain control Reactions / problems with new medications (rash/itching, nausea, etc)  Fever over 101.5 F (38.5 C) Worsening swelling or bruising Worsening pain, productive cough, difficulty breathing or any other concerning symptoms  The clinic staff is available to answer your questions during regular business hours (8:30am-5pm). Please don't hesitate to call and ask to speak to one of our nurses for clinical concerns.  If you have a medical emergency, go to the nearest emergency room or call 911.  A surgeon from [facility] is always on call at the hospitals  [facility], [place], [place], [place], [place] [address] ?  MAIN: [phone] ? TOLL FREE: [phone] ?  FAX [phone]  [email or web address]      Information on Rib Fractures  A rib fracture is a break or crack in one of the bones of the ribs. The ribs are  long, curved bones that wrap around your chest and attach to your spine and your breastbone. The ribs protect your heart, lungs, and other organs in the chest. A broken or cracked rib is often painful but is not usually serious. Most rib fractures heal on their own over time. However, rib fractures can be more serious if multiple ribs are broken or if broken ribs move out of place and push against other structures or organs. What are the causes? This condition is caused by: Repetitive movements with high force, such as pitching a baseball or having severe coughing spells. A direct blow to the chest, such as a sports injury, a car accident, or a fall. Cancer that has spread to the bones, which can weaken bones and cause them to break. What are the signs or symptoms? Symptoms of this condition include: Pain when you breathe in or cough. Pain when someone presses on the injured area. Feeling short of breath. How is this diagnosed? This condition is diagnosed with a physical exam and medical history. Imaging tests may also be done, such as: Chest X-ray. CT scan. MRI. Bone scan. Chest ultrasound. How is this treated? Treatment for this condition depends on the severity of the fracture. Most rib fractures usually heal on their own in 1-3 months. Sometimes healing takes longer if there is a cough that does not stop or if there are other activities that make the injury worse (aggravating factors). While you heal, you will be given medicines to control the pain. You will also be taught deep breathing exercises. Severe injuries may require hospitalization or surgery. Follow these instructions at home: Managing pain, stiffness, and swelling If directed, apply ice to the injured area. Put ice in a plastic bag. Place a towel between your skin and the bag. Leave the ice on for 20 minutes, 2-3 times a day. Take over-the-counter and prescription medicines only as told by your health care  provider. Activity Avoid a lot of activity and any activities or movements that cause pain. Be careful during activities and avoid bumping the injured rib. Slowly increase your activity as told by your health care provider. General instructions Do deep breathing exercises as told by your health care provider. This helps prevent pneumonia, which is a common complication of a broken rib. Your health care provider may instruct you to: Take deep breaths several times a day. Try to cough several times a day, holding a pillow against the injured area. Use a device called incentive spirometer to practice deep breathing several times a day. Drink enough fluid to keep your urine pale yellow. Do not wear a rib belt or binder. These restrict breathing, which can lead to pneumonia. Keep all follow-up visits as told by your health care provider. This is important. Contact a health care provider if: You have a fever. Get help right away if: You have difficulty breathing or you are short of breath. You develop a cough that does not stop, or you cough up thick or bloody sputum. You have nausea, vomiting, or pain in your abdomen. Your pain gets worse and medicine does not help. Summary A rib fracture is  a break or crack in one of the bones of the ribs. A broken or cracked rib is often painful but is not usually serious. Most rib fractures heal on their own over time. Treatment for this condition depends on the severity of the fracture. Avoid a lot of activity and any activities or movements that cause pain. This information is not intended to replace advice given to you by your health care provider. Make sure you discuss any questions you have with your health care provider. Document Released: 04/01/2005 Document Revised: 07/01/2016 Document Reviewed: 07/01/2016 Elsevier Interactive Patient Education  2019 [employer].
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
## 2021-01-12 LAB — BASIC METABOLIC PANEL
Anion gap: 5 (ref 5–15)
BUN: 10 mg/dL (ref 8–23)
CO2: 29 mmol/L (ref 22–32)
Calcium: 9.3 mg/dL (ref 8.9–10.3)
Chloride: 98 mmol/L (ref 98–111)
Creatinine, Ser: 0.75 mg/dL (ref 0.61–1.24)
GFR, Estimated: >60 mL/min (ref >60)
Glucose, Bld: 109 mg/dL — ABNORMAL HIGH (ref 70–99)
Potassium: 3.9 mmol/L (ref 3.5–5.1)
Sodium: 132 mmol/L — ABNORMAL LOW (ref 135–145)

## 2021-01-12 LAB — CBC
HCT: 34.3 % — ABNORMAL LOW (ref 39.0–52.0)
Hemoglobin: 11.4 g/dL — ABNORMAL LOW (ref 13.0–17.0)
MCH: 31.5 pg (ref 26.0–34.0)
MCHC: 33.2 g/dL (ref 30.0–36.0)
MCV: 94.8 fL (ref 80.0–100.0)
Platelets: 183 10*3/uL (ref 150–400)
RBC: 3.62 MIL/uL — ABNORMAL LOW (ref 4.22–5.81)
RDW: 12.7 % (ref 11.5–15.5)
WBC: 8.3 10*3/uL (ref 4.0–10.5)
nRBC: 0 % (ref 0.0–0.2)

## 2021-01-12 MED ORDER — IPRATROPIUM-ALBUTEROL 0.5-2.5 (3) MG/3ML IN SOLN
3.0000 mL | Freq: Two times a day (BID) | RESPIRATORY_TRACT | Status: DC
  Administered 2021-01-13 (×2): 3 mL via RESPIRATORY_TRACT
  Filled 2021-01-12 (×2): qty 3

## 2021-01-12 MED ORDER — IPRATROPIUM-ALBUTEROL 0.5-2.5 (3) MG/3ML IN SOLN
3.0000 mL | Freq: Four times a day (QID) | RESPIRATORY_TRACT | Status: DC
  Administered 2021-01-12 (×3): 3 mL via RESPIRATORY_TRACT
  Filled 2021-01-12 (×3): qty 3
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
## 2021-01-12 NOTE — TOC Progression Note (Addendum)
Transition of Care [facility]) - Progression Note    Patient Details  Name: Derek Henderson MRN: [id number] Date of Birth: [birth year]  Transition of Care [facility]) CM/SW Contact  Ella Bodo, RN Phone Number: 01/12/2021, 2:02 PM  Clinical Narrative: Home health follow-up has been arranged, per orders.  Family states they have a rolling walker and 3 in 1 at home, and plan to borrow a wheelchair from a friend.    Expected Discharge Plan: [place] Barriers to Discharge: Continued Medical Work up  Expected Discharge Plan and Services Expected Discharge Plan: [place]   Discharge Planning Services: CM Consult Post Acute Care Choice: [place] arrangements for the past 2 months: Single Family Home                                       Social Determinants of Health (SDOH) Interventions    Readmission Risk Interventions No flowsheet data found.  Reinaldo Raddle, RN, BSN  Trauma/Neuro ICU Case Manager [phone]
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
## 2021-01-13 LAB — POCT I-STAT 7, (LYTES, BLD GAS, ICA,H+H)
Acid-Base Excess: 8 mmol/L — ABNORMAL HIGH (ref 0.0–2.0)
Bicarbonate: 32.8 mmol/L — ABNORMAL HIGH (ref 20.0–28.0)
Calcium, Ion: 1.25 mmol/L (ref 1.15–1.40)
HCT: 34 % — ABNORMAL LOW (ref 39.0–52.0)
Hemoglobin: 11.6 g/dL — ABNORMAL LOW (ref 13.0–17.0)
O2 Saturation: 82 %
Patient temperature: 99.1
Potassium: 4.2 mmol/L (ref 3.5–5.1)
Sodium: 132 mmol/L — ABNORMAL LOW (ref 135–145)
TCO2: 34 mmol/L — ABNORMAL HIGH (ref 22–32)
pCO2 arterial: 46.3 mmHg (ref 32.0–48.0)
pH, Arterial: 7.46 — ABNORMAL HIGH (ref 7.350–7.450)
pO2, Arterial: 45 mmHg — ABNORMAL LOW (ref 83.0–108.0)
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
## 2021-01-13 NOTE — Progress Notes (Signed)
Trialing patient off oxygen for 1 hour and then obtaining an ABG to assess patient's oxygen saturation per MD Rosendo Gros.  Nasal Cannula removed at 0800 Patient's pulse ox sats ranging from 83-88%.  ABG completed at 0900:  Ref. Range 01/13/2021 09:20  Sample type Unknown ARTERIAL  pH, Arterial Latest Ref Range: 7.350 - 7.450  7.460 (H)  pCO2 arterial Latest Ref Range: 32.0 - 48.0 mmHg 46.3  pO2, Arterial Latest Ref Range: 83.0 - 108.0 mmHg 45 (L)  TCO2 Latest Ref Range: 22 - 32 mmol/L 34 (H)  Acid-Base Excess Latest Ref Range: 0.0 - 2.0 mmol/L 8.0 (H)  Bicarbonate Latest Ref Range: 20.0 - 28.0 mmol/L 32.8 (H)  O2 Saturation Latest Units: % 82.0  Patient temperature Unknown 99.1 F  Collection site Unknown Radial   Patient placed back on nasal cannula oxygen and MD Rosendo Gros notified of results.
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
## 2021-01-13 NOTE — Progress Notes (Signed)
Pt admitted to the unit as a transfer from 4N ICU. Pt alert and verbally responsive, pt oriented the unit and room, bed alarm on, call light within reach, safety precaution initiated and education completed with pt. Pt denies any pain, no pressure ulcer or opened wound noted, pt assessed to have temp, prn tylenol adm effective,Will continue to closely monitor pt. Delia Heady RN   01/13/21 1431  Vitals  Temp (!) 100.5 F (38.1 C) (RN present notified temp)  Temp Source Oral  BP (!) 143/61  MAP (mmHg) 82  BP Location Left Arm  BP Method Automatic  Patient Position (if appropriate) Lying  Pulse Rate 90  Pulse Rate Source Monitor  ECG Heart Rate 90  Resp (!) 21  MEWS COLOR  MEWS Score Color Yellow  Oxygen Therapy  SpO2 90 %  O2 Device Nasal Cannula  O2 Flow Rate (L/min) 2 L/min  Pain Assessment  Pain Scale 0-10  Pain Score 0  MEWS Score  MEWS Temp 1  MEWS Systolic 0  MEWS Pulse 0  MEWS RR 1  MEWS LOC 0  MEWS Score 2

## 2021-01-14 MED ORDER — METHOCARBAMOL 500 MG PO TABS
500.0000 mg | ORAL_TABLET | Freq: Four times a day (QID) | ORAL | Status: DC | PRN
  Administered 2021-01-14: 500 mg via ORAL
  Filled 2021-01-14: qty 1

## 2021-01-14 MED ORDER — IPRATROPIUM-ALBUTEROL 0.5-2.5 (3) MG/3ML IN SOLN
3.0000 mL | Freq: Four times a day (QID) | RESPIRATORY_TRACT | Status: DC
Start: 2021-01-14 — End: 2021-01-14
  Administered 2021-01-14 (×2): 3 mL via RESPIRATORY_TRACT
  Filled 2021-01-14 (×2): qty 3

## 2021-01-14 MED ORDER — IPRATROPIUM-ALBUTEROL 0.5-2.5 (3) MG/3ML IN SOLN: 3.0000 mL | RESPIRATORY_TRACT | Status: DC | PRN

## 2021-01-14 MED ORDER — TRAMADOL HCL 50 MG PO TABS
50.0000 mg | ORAL_TABLET | Freq: Four times a day (QID) | ORAL | Status: DC | PRN
Start: 2021-01-14 — End: 2021-01-16

## 2021-01-14 MED ORDER — IPRATROPIUM-ALBUTEROL 0.5-2.5 (3) MG/3ML IN SOLN
3.0000 mL | Freq: Two times a day (BID) | RESPIRATORY_TRACT | Status: DC
  Administered 2021-01-15 – 2021-01-16 (×3): 3 mL via RESPIRATORY_TRACT
  Filled 2021-01-14 (×3): qty 3

## 2021-01-14 MED ORDER — ENSURE ENLIVE PO LIQD
237.0000 mL | Freq: Two times a day (BID) | ORAL | Status: DC
  Administered 2021-01-14: 237 mL via ORAL

## 2021-01-14 MED ORDER — ACETAMINOPHEN 325 MG PO TABS
650.0000 mg | ORAL_TABLET | Freq: Four times a day (QID) | ORAL | Status: DC
  Administered 2021-01-14 – 2021-01-16 (×9): 650 mg via ORAL
  Filled 2021-01-14 (×9): qty 2

## 2021-01-14 MED ORDER — GUAIFENESIN 200 MG PO TABS
200.0000 mg | ORAL_TABLET | Freq: Four times a day (QID) | ORAL | Status: DC
  Administered 2021-01-14 – 2021-01-16 (×9): 200 mg via ORAL
  Filled 2021-01-14 (×9): qty 1

## 2021-01-14 MED ORDER — MORPHINE SULFATE (PF) 4 MG/ML IV SOLN: 4.0000 mg | INTRAVENOUS | Status: DC | PRN
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
## 2021-01-15 ENCOUNTER — Inpatient Hospital Stay (HOSPITAL_COMMUNITY): Payer: Medicare HMO

## 2021-01-15 LAB — CBC
HCT: 33.2 % — ABNORMAL LOW (ref 39.0–52.0)
Hemoglobin: 11.2 g/dL — ABNORMAL LOW (ref 13.0–17.0)
MCH: 31.6 pg (ref 26.0–34.0)
MCHC: 33.7 g/dL (ref 30.0–36.0)
MCV: 93.8 fL (ref 80.0–100.0)
Platelets: 282 10*3/uL (ref 150–400)
RBC: 3.54 MIL/uL — ABNORMAL LOW (ref 4.22–5.81)
RDW: 12.6 % (ref 11.5–15.5)
WBC: 10.3 10*3/uL (ref 4.0–10.5)
nRBC: 0 % (ref 0.0–0.2)

## 2021-01-15 LAB — BASIC METABOLIC PANEL
Anion gap: 7 (ref 5–15)
BUN: 13 mg/dL (ref 8–23)
CO2: 28 mmol/L (ref 22–32)
Calcium: 9.4 mg/dL (ref 8.9–10.3)
Chloride: 95 mmol/L — ABNORMAL LOW (ref 98–111)
Creatinine, Ser: 0.62 mg/dL (ref 0.61–1.24)
GFR, Estimated: >60 mL/min (ref >60)
Glucose, Bld: 116 mg/dL — ABNORMAL HIGH (ref 70–99)
Potassium: 3.8 mmol/L (ref 3.5–5.1)
Sodium: 130 mmol/L — ABNORMAL LOW (ref 135–145)
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
## 2021-01-15 NOTE — Care Management Important Message (Signed)
Important Message  Patient Details  Name: Derek Henderson MRN: [id number] Date of Birth: 08-31-1945   Medicare Important Message Given:  Yes     Orbie Pyo 01/15/2021, 3:52 PM

## 2021-01-16 MED ORDER — GUAIFENESIN 200 MG PO TABS: 200.0000 mg | ORAL_TABLET | Freq: Four times a day (QID) | ORAL | 0 refills | Status: DC | PRN

## 2021-01-16 MED ORDER — ACETAMINOPHEN 325 MG PO TABS: 650.0000 mg | ORAL_TABLET | Freq: Four times a day (QID) | ORAL | Status: AC | PRN

## 2021-01-16 MED ORDER — METHOCARBAMOL 500 MG PO TABS: 500.0000 mg | ORAL_TABLET | Freq: Four times a day (QID) | ORAL | 0 refills | Status: DC | PRN
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
## 2021-01-16 NOTE — Progress Notes (Signed)
SATURATION QUALIFICATIONS: (This note is used to comply with regulatory documentation for home oxygen)  Patient Saturations on Room Air at Rest = 90%  Patient Saturations on Room Air while Ambulating = 81%  Patient Saturations on 2 Liters of oxygen while Ambulating = 90%  Please briefly explain why patient needs home oxygen: to maintain SPO2 during mobility   Kaylenn Civil S, PT DPT Acute Rehabilitation Services Pager [phone]  Office [phone]
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
## 2021-01-16 NOTE — TOC Transition Note (Addendum)
Transition of Care [facility]) - CM/SW Discharge Note   Patient Details  Name: Derek Henderson MRN: [id number] Date of Birth: 10-30-1945  [facility] CM/SW Contact:  Ella Bodo, RN Phone Number: 01/16/2021, 11:45am  Clinical Narrative:    Patient medically stable for discharge home today with family to assist and home health services as previously arranged.  Referral to [facility] for home oxygen set up, as patient continues to desaturate with ambulation.  Confirmed with patient's wife that patient has wheelchair available and bedside commode for home use.   Final next level of care: [place] Barriers to Discharge: Barriers Resolved   Patient Goals and CMS Choice Patient states their goals for this hospitalization and ongoing recovery are:: to go home CMS Medicare.gov Compare Post Acute Care list provided to:: Patient Choice offered to / list presented to : Patient                         Discharge Plan and Services   Discharge Planning Services: CM Consult Post Acute Care Choice: Home Health          DME Arranged: Oxygen DME Agency: [employer] Date DME Agency Contacted: 01/16/21 Time DME Agency Contacted: [id number] Representative spoke with at DME Agency: Freda Munro HH Arranged: PT, OT [facility] Agency: [place] Date [place]: 01/11/21 Time [place]: 1200 Representative spoke with at [place]: Adela Lank    Readmission Risk Interventions Readmission Risk Prevention Plan 01/16/2021  [place] Complete  Medication Screening Complete  Transportation Screening Complete   Reinaldo Raddle, RN, BSN  Trauma/Neuro ICU Case Manager [phone]

## 2021-01-18 DIAGNOSIS — S065XAD Traumatic subdural hemorrhage with loss of consciousness status unknown, subsequent encounter: Secondary | ICD-10-CM | POA: Diagnosis not present

## 2021-01-18 DIAGNOSIS — R531 Weakness: Secondary | ICD-10-CM | POA: Diagnosis not present

## 2021-01-18 DIAGNOSIS — S065X9A Traumatic subdural hemorrhage with loss of consciousness of unspecified duration, initial encounter: Secondary | ICD-10-CM | POA: Diagnosis not present

## 2021-01-19 ENCOUNTER — Telehealth (INDEPENDENT_AMBULATORY_CARE_PROVIDER_SITE_OTHER): Payer: Self-pay | Admitting: Vascular Surgery

## 2021-01-19 ENCOUNTER — Inpatient Hospital Stay: Payer: Medicare HMO | Admitting: Family
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
## 2021-01-22 DIAGNOSIS — S0232XA Fracture of orbital floor, left side, initial encounter for closed fracture: Secondary | ICD-10-CM | POA: Diagnosis not present

## 2021-01-22 DIAGNOSIS — S0240DA Maxillary fracture, left side, initial encounter for closed fracture: Secondary | ICD-10-CM | POA: Diagnosis not present

## 2021-01-22 NOTE — Telephone Encounter (Signed)
I tried to call the pt back at the number that was left and it just kept ringing I was not able to leave a VM with the NP's instructions.

## 2021-01-23 DIAGNOSIS — S92001A Unspecified fracture of right calcaneus, initial encounter for closed fracture: Secondary | ICD-10-CM | POA: Diagnosis not present

## 2021-01-23 DIAGNOSIS — S12600A Unspecified displaced fracture of seventh cervical vertebra, initial encounter for closed fracture: Secondary | ICD-10-CM | POA: Diagnosis not present

## 2021-01-23 DIAGNOSIS — S92011A Displaced fracture of body of right calcaneus, initial encounter for closed fracture: Secondary | ICD-10-CM | POA: Diagnosis not present

## 2021-01-23 DIAGNOSIS — S12500A Unspecified displaced fracture of sixth cervical vertebra, initial encounter for closed fracture: Secondary | ICD-10-CM | POA: Diagnosis not present

## 2021-01-23 DIAGNOSIS — N2889 Other specified disorders of kidney and ureter: Secondary | ICD-10-CM | POA: Diagnosis not present

## 2021-01-23 DIAGNOSIS — S065X0A Traumatic subdural hemorrhage without loss of consciousness, initial encounter: Secondary | ICD-10-CM | POA: Diagnosis not present

## 2021-01-23 DIAGNOSIS — Z87891 Personal history of nicotine dependence: Secondary | ICD-10-CM | POA: Diagnosis not present

## 2021-01-23 DIAGNOSIS — Z23 Encounter for immunization: Secondary | ICD-10-CM | POA: Diagnosis not present

## 2021-01-23 DIAGNOSIS — J449 Chronic obstructive pulmonary disease, unspecified: Secondary | ICD-10-CM | POA: Diagnosis not present

## 2021-01-29 DIAGNOSIS — S92001A Unspecified fracture of right calcaneus, initial encounter for closed fracture: Secondary | ICD-10-CM | POA: Diagnosis not present

## 2021-01-30 ENCOUNTER — Ambulatory Visit
Admission: RE | Admit: 2021-01-30 | Discharge: 2021-01-30 | Disposition: A | Discharge status: Home / Self Care | Payer: Medicare HMO | Source: Ambulatory Visit | Attending: Internal Medicine | Admitting: Internal Medicine

## 2021-01-30 ENCOUNTER — Other Ambulatory Visit: Payer: Self-pay | Admitting: Internal Medicine

## 2021-01-30 ENCOUNTER — Other Ambulatory Visit: Payer: Self-pay

## 2021-01-30 DIAGNOSIS — S065XAA Traumatic subdural hemorrhage with loss of consciousness status unknown, initial encounter: Secondary | ICD-10-CM | POA: Insufficient documentation

## 2021-01-30 DIAGNOSIS — N2889 Other specified disorders of kidney and ureter: Secondary | ICD-10-CM | POA: Diagnosis not present

## 2021-01-30 DIAGNOSIS — S92001D Unspecified fracture of right calcaneus, subsequent encounter for fracture with routine healing: Secondary | ICD-10-CM | POA: Diagnosis not present

## 2021-01-30 DIAGNOSIS — S0232XA Fracture of orbital floor, left side, initial encounter for closed fracture: Secondary | ICD-10-CM | POA: Diagnosis not present

## 2021-01-30 DIAGNOSIS — I6200 Nontraumatic subdural hemorrhage, unspecified: Secondary | ICD-10-CM | POA: Diagnosis not present

## 2021-01-30 DIAGNOSIS — I6529 Occlusion and stenosis of unspecified carotid artery: Secondary | ICD-10-CM | POA: Diagnosis not present

## 2021-01-30 DIAGNOSIS — I739 Peripheral vascular disease, unspecified: Secondary | ICD-10-CM | POA: Diagnosis not present

## 2021-01-30 DIAGNOSIS — I6782 Cerebral ischemia: Secondary | ICD-10-CM | POA: Diagnosis not present

## 2021-02-05 DIAGNOSIS — S065XAD Traumatic subdural hemorrhage with loss of consciousness status unknown, subsequent encounter: Secondary | ICD-10-CM | POA: Diagnosis not present

## 2021-02-05 DIAGNOSIS — S2243XD Multiple fractures of ribs, bilateral, subsequent encounter for fracture with routine healing: Secondary | ICD-10-CM | POA: Diagnosis not present

## 2021-02-05 DIAGNOSIS — S12500D Unspecified displaced fracture of sixth cervical vertebra, subsequent encounter for fracture with routine healing: Secondary | ICD-10-CM | POA: Diagnosis not present

## 2021-02-05 DIAGNOSIS — S12600D Unspecified displaced fracture of seventh cervical vertebra, subsequent encounter for fracture with routine healing: Secondary | ICD-10-CM | POA: Diagnosis not present

## 2021-02-05 DIAGNOSIS — S22029D Unspecified fracture of second thoracic vertebra, subsequent encounter for fracture with routine healing: Secondary | ICD-10-CM | POA: Diagnosis not present

## 2021-02-05 DIAGNOSIS — S92061D Displaced intraarticular fracture of right calcaneus, subsequent encounter for fracture with routine healing: Secondary | ICD-10-CM | POA: Diagnosis not present

## 2021-02-05 DIAGNOSIS — S22019D Unspecified fracture of first thoracic vertebra, subsequent encounter for fracture with routine healing: Secondary | ICD-10-CM | POA: Diagnosis not present

## 2021-02-06 DIAGNOSIS — S129XXA Fracture of neck, unspecified, initial encounter: Secondary | ICD-10-CM | POA: Diagnosis not present

## 2021-02-06 DIAGNOSIS — M50323 Other cervical disc degeneration at C6-C7 level: Secondary | ICD-10-CM | POA: Diagnosis not present

## 2021-02-06 DIAGNOSIS — M50322 Other cervical disc degeneration at C5-C6 level: Secondary | ICD-10-CM | POA: Diagnosis not present

## 2021-02-06 DIAGNOSIS — Z8781 Personal history of (healed) traumatic fracture: Secondary | ICD-10-CM | POA: Diagnosis not present
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
## 2021-02-07 ENCOUNTER — Ambulatory Visit: Payer: Medicare HMO | Admitting: Urology

## 2021-02-07 ENCOUNTER — Other Ambulatory Visit: Payer: Self-pay

## 2021-02-07 VITALS — BP 140/80 | HR 70 | Ht 73.0 in | Wt 134.0 lb

## 2021-02-07 DIAGNOSIS — N2889 Other specified disorders of kidney and ureter: Secondary | ICD-10-CM | POA: Diagnosis not present

## 2021-02-08 DIAGNOSIS — I739 Peripheral vascular disease, unspecified: Secondary | ICD-10-CM | POA: Diagnosis not present

## 2021-02-08 DIAGNOSIS — S92011D Displaced fracture of body of right calcaneus, subsequent encounter for fracture with routine healing: Secondary | ICD-10-CM | POA: Diagnosis not present

## 2021-02-08 DIAGNOSIS — M79671 Pain in right foot: Secondary | ICD-10-CM | POA: Diagnosis not present

## 2021-02-14 ENCOUNTER — Other Ambulatory Visit (INDEPENDENT_AMBULATORY_CARE_PROVIDER_SITE_OTHER): Payer: Self-pay | Admitting: Nurse Practitioner

## 2021-02-14 ENCOUNTER — Encounter (INDEPENDENT_AMBULATORY_CARE_PROVIDER_SITE_OTHER): Payer: Self-pay | Admitting: Nurse Practitioner

## 2021-02-14 ENCOUNTER — Other Ambulatory Visit: Payer: Self-pay

## 2021-02-14 ENCOUNTER — Ambulatory Visit (INDEPENDENT_AMBULATORY_CARE_PROVIDER_SITE_OTHER): Payer: Medicare HMO | Admitting: Nurse Practitioner

## 2021-02-14 ENCOUNTER — Ambulatory Visit (INDEPENDENT_AMBULATORY_CARE_PROVIDER_SITE_OTHER): Payer: Medicare HMO

## 2021-02-14 VITALS — BP 134/73 | HR 74 | Resp 16 | Wt 138.0 lb

## 2021-02-14 DIAGNOSIS — S91301A Unspecified open wound, right foot, initial encounter: Secondary | ICD-10-CM

## 2021-02-14 DIAGNOSIS — I10 Essential (primary) hypertension: Secondary | ICD-10-CM | POA: Diagnosis not present

## 2021-02-14 DIAGNOSIS — I70234 Atherosclerosis of native arteries of right leg with ulceration of heel and midfoot: Secondary | ICD-10-CM

## 2021-02-14 DIAGNOSIS — Z720 Tobacco use: Secondary | ICD-10-CM

## 2021-02-15 ENCOUNTER — Telehealth (INDEPENDENT_AMBULATORY_CARE_PROVIDER_SITE_OTHER): Payer: Self-pay

## 2021-02-15 ENCOUNTER — Encounter (INDEPENDENT_AMBULATORY_CARE_PROVIDER_SITE_OTHER): Payer: Self-pay | Admitting: Nurse Practitioner

## 2021-02-15 DIAGNOSIS — S92011D Displaced fracture of body of right calcaneus, subsequent encounter for fracture with routine healing: Secondary | ICD-10-CM | POA: Diagnosis not present

## 2021-02-15 DIAGNOSIS — L97512 Non-pressure chronic ulcer of other part of right foot with fat layer exposed: Secondary | ICD-10-CM | POA: Diagnosis not present
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
## 2021-02-15 NOTE — Telephone Encounter (Signed)
Patient was seen 02/14/21 by Eulogio Ditch NP, Arna Medici discussed the patient's ultrasound condition and his need for a procedure. I contacted the patient to schedule him for a RLE angio with Dr. Lucky Cowboy. The patient stated " my leg is swollen and I don't have blood getting to my foot so I am going to wait and see if the swelling will go down and my foot get better". I attempted to explain that the reason for the angio is to help with blood flow and swelling. Patient stated " I may lose my foot so no need to have two surgeries and I lose my foot anyway so I am going to wait". I advised the patient to contact our office if he decides to schedule his procedure.
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
## 2021-02-15 NOTE — Telephone Encounter (Signed)
UPDATE: patient's daughter called back to schedule the patient for his RLE angio with Dr. Lucky Cowboy. Patient was scheduled for a 02/19/21 with a 8:15 am arrival time to the MM. Pre-procedure instructions were discussed and the patient's daughter stated she understood. Patient's daughter also said that his other doctor wanted him to have the procedure.
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
## 2021-02-18 DIAGNOSIS — R531 Weakness: Secondary | ICD-10-CM | POA: Diagnosis not present

## 2021-02-18 DIAGNOSIS — S065X9A Traumatic subdural hemorrhage with loss of consciousness of unspecified duration, initial encounter: Secondary | ICD-10-CM | POA: Diagnosis not present

## 2021-02-19 ENCOUNTER — Telehealth (INDEPENDENT_AMBULATORY_CARE_PROVIDER_SITE_OTHER): Payer: Self-pay

## 2021-02-19 ENCOUNTER — Other Ambulatory Visit: Payer: Self-pay

## 2021-02-19 ENCOUNTER — Encounter
Admission: RE | Disposition: A | Discharge status: Home / Self Care | Payer: Self-pay | Source: Ambulatory Visit | Attending: Vascular Surgery

## 2021-02-19 ENCOUNTER — Other Ambulatory Visit (INDEPENDENT_AMBULATORY_CARE_PROVIDER_SITE_OTHER): Payer: Self-pay | Admitting: Nurse Practitioner

## 2021-02-19 ENCOUNTER — Encounter: Payer: Self-pay | Admitting: Vascular Surgery

## 2021-02-19 ENCOUNTER — Ambulatory Visit
Admission: RE | Admit: 2021-02-19 | Discharge: 2021-02-19 | Disposition: A | Discharge status: Home / Self Care | Payer: Medicare HMO | Source: Ambulatory Visit | Attending: Vascular Surgery | Admitting: Vascular Surgery

## 2021-02-19 DIAGNOSIS — I70212 Atherosclerosis of native arteries of extremities with intermittent claudication, left leg: Secondary | ICD-10-CM

## 2021-02-19 DIAGNOSIS — L97909 Non-pressure chronic ulcer of unspecified part of unspecified lower leg with unspecified severity: Secondary | ICD-10-CM

## 2021-02-19 DIAGNOSIS — Z539 Procedure and treatment not carried out, unspecified reason: Secondary | ICD-10-CM | POA: Diagnosis not present

## 2021-02-19 DIAGNOSIS — I10 Essential (primary) hypertension: Secondary | ICD-10-CM | POA: Insufficient documentation

## 2021-02-19 DIAGNOSIS — Z7982 Long term (current) use of aspirin: Secondary | ICD-10-CM | POA: Insufficient documentation

## 2021-02-19 DIAGNOSIS — I70261 Atherosclerosis of native arteries of extremities with gangrene, right leg: Secondary | ICD-10-CM | POA: Insufficient documentation

## 2021-02-19 DIAGNOSIS — L97419 Non-pressure chronic ulcer of right heel and midfoot with unspecified severity: Secondary | ICD-10-CM | POA: Diagnosis not present

## 2021-02-19 DIAGNOSIS — Z7902 Long term (current) use of antithrombotics/antiplatelets: Secondary | ICD-10-CM | POA: Diagnosis not present

## 2021-02-19 DIAGNOSIS — I70299 Other atherosclerosis of native arteries of extremities, unspecified extremity: Secondary | ICD-10-CM

## 2021-02-19 LAB — BUN: BUN: 13 mg/dL (ref 8–23)

## 2021-02-19 LAB — CREATININE, SERUM
Creatinine, Ser: 0.78 mg/dL (ref 0.61–1.24)
GFR, Estimated: >60 mL/min (ref >60)

## 2021-02-19 SURGERY — LOWER EXTREMITY ANGIOGRAPHY
Anesthesia: Moderate Sedation | Site: Leg Lower | Laterality: Right

## 2021-02-19 MED ORDER — METHYLPREDNISOLONE SODIUM SUCC 125 MG IJ SOLR: 125.0000 mg | Freq: Once | INTRAMUSCULAR | Status: DC | PRN

## 2021-02-19 MED ORDER — SODIUM CHLORIDE 0.9 % IV SOLN: INTRAVENOUS | Status: DC

## 2021-02-19 MED ORDER — CEFAZOLIN SODIUM-DEXTROSE 2-4 GM/100ML-% IV SOLN: 2.0000 g | Freq: Once | INTRAVENOUS | Status: DC

## 2021-02-19 MED ORDER — HYDROMORPHONE HCL 1 MG/ML IJ SOLN: 1.0000 mg | Freq: Once | INTRAMUSCULAR | Status: DC | PRN

## 2021-02-19 MED ORDER — DIPHENHYDRAMINE HCL 50 MG/ML IJ SOLN: 50.0000 mg | Freq: Once | INTRAMUSCULAR | Status: DC | PRN

## 2021-02-19 MED ORDER — FAMOTIDINE 20 MG PO TABS: 40.0000 mg | ORAL_TABLET | Freq: Once | ORAL | Status: DC | PRN

## 2021-02-19 MED ORDER — ONDANSETRON HCL 4 MG/2ML IJ SOLN: 4.0000 mg | Freq: Four times a day (QID) | INTRAMUSCULAR | Status: DC | PRN

## 2021-02-19 MED ORDER — MIDAZOLAM HCL 2 MG/ML PO SYRP: 8.0000 mg | ORAL_SOLUTION | Freq: Once | ORAL | Status: DC | PRN

## 2021-02-19 NOTE — Interval H&P Note (Signed)
History and Physical Interval Note:  02/19/2021 8:19 AM  Derek Henderson  has presented today for surgery, with the diagnosis of RLE Angiography   ASO with ulceration   BARD Rep  cc: Judi Cong.  The various methods of treatment have been discussed with the patient and family. After consideration of risks, benefits and other options for treatment, the patient has consented to  Procedure(s): LOWER EXTREMITY ANGIOGRAPHY (Right) as a surgical intervention.  The patient's history has been reviewed, patient examined, no change in status, stable for surgery.  I have reviewed the patient's chart and labs.  Questions were answered to the patient's satisfaction.     Leotis Pain

## 2021-02-19 NOTE — Progress Notes (Signed)
Patient to [facility] today for procedure. Prepped in pre-op, but wife received a phone call from [facility] at same time letting them know that insurance had not approved for this procedure. [facility] on the phone with insurance trying to get approval, patient decided he did not want to wait, but would be willing to reschedule for another day pending insurance approval. Notified [facility] of patient's decision. Procedure cancelled today due to awaiting insurance approval. Patient left with family.

## 2021-02-19 NOTE — Telephone Encounter (Signed)
Spoke with the patient's spouse about the patient being rescheduled for his RLE angio and was told they wanted to wait to see if the swelling was going to go down before deciding about have this procedure done. I advised that they call when he is ready to be scheduled again

## 2021-02-20 DIAGNOSIS — N2889 Other specified disorders of kidney and ureter: Secondary | ICD-10-CM | POA: Diagnosis not present

## 2021-02-20 DIAGNOSIS — S92001D Unspecified fracture of right calcaneus, subsequent encounter for fracture with routine healing: Secondary | ICD-10-CM | POA: Diagnosis not present

## 2021-02-21 ENCOUNTER — Telehealth (INDEPENDENT_AMBULATORY_CARE_PROVIDER_SITE_OTHER): Payer: Self-pay
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
## 2021-02-21 NOTE — Telephone Encounter (Signed)
Patient spouse was made aware with medical advice and verbalized that they will contact the office when ready to move forward with rescheduling his procedure.

## 2021-02-22 NOTE — Telephone Encounter (Signed)
Patient's wife called and stated that the patient was going to have his procedure with Dr. Lucky Cowboy. Patient was scheduled for a RLE angio on 02/26/21 with a 11:30 am arrival time to the MM. Spouse also wanted [place] to call the patient's lawyer and give information, I explained that the lawyer can request records from our office once the release is signed by them.

## 2021-02-26 ENCOUNTER — Other Ambulatory Visit: Payer: Self-pay

## 2021-02-26 ENCOUNTER — Encounter: Payer: Self-pay | Admitting: Emergency Medicine

## 2021-02-26 ENCOUNTER — Other Ambulatory Visit (INDEPENDENT_AMBULATORY_CARE_PROVIDER_SITE_OTHER): Payer: Self-pay | Admitting: Nurse Practitioner

## 2021-02-26 ENCOUNTER — Emergency Department
Admission: EM | Admit: 2021-02-26 | Discharge: 2021-02-27 | Disposition: A | Discharge status: Home / Self Care | Payer: Medicare HMO | Attending: Emergency Medicine | Admitting: Emergency Medicine

## 2021-02-26 ENCOUNTER — Ambulatory Visit
Admission: RE | Admit: 2021-02-26 | Discharge: 2021-02-26 | Disposition: A | Discharge status: Home / Self Care | Payer: Medicare HMO | Source: Home / Self Care | Attending: Vascular Surgery | Admitting: Vascular Surgery

## 2021-02-26 ENCOUNTER — Encounter
Admission: RE | Disposition: A | Discharge status: Home / Self Care | Payer: Self-pay | Source: Home / Self Care | Attending: Vascular Surgery

## 2021-02-26 ENCOUNTER — Encounter: Payer: Self-pay | Admitting: Vascular Surgery

## 2021-02-26 DIAGNOSIS — I70261 Atherosclerosis of native arteries of extremities with gangrene, right leg: Secondary | ICD-10-CM | POA: Insufficient documentation

## 2021-02-26 DIAGNOSIS — I70299 Other atherosclerosis of native arteries of extremities, unspecified extremity: Secondary | ICD-10-CM

## 2021-02-26 DIAGNOSIS — G8918 Other acute postprocedural pain: Secondary | ICD-10-CM | POA: Diagnosis not present

## 2021-02-26 DIAGNOSIS — R609 Edema, unspecified: Secondary | ICD-10-CM | POA: Diagnosis not present

## 2021-02-26 DIAGNOSIS — I10 Essential (primary) hypertension: Secondary | ICD-10-CM | POA: Insufficient documentation

## 2021-02-26 DIAGNOSIS — Z4800 Encounter for change or removal of nonsurgical wound dressing: Secondary | ICD-10-CM | POA: Insufficient documentation

## 2021-02-26 DIAGNOSIS — Z87891 Personal history of nicotine dependence: Secondary | ICD-10-CM | POA: Insufficient documentation

## 2021-02-26 DIAGNOSIS — I70234 Atherosclerosis of native arteries of right leg with ulceration of heel and midfoot: Secondary | ICD-10-CM | POA: Insufficient documentation

## 2021-02-26 DIAGNOSIS — Z5189 Encounter for other specified aftercare: Secondary | ICD-10-CM

## 2021-02-26 DIAGNOSIS — L97419 Non-pressure chronic ulcer of right heel and midfoot with unspecified severity: Secondary | ICD-10-CM | POA: Insufficient documentation

## 2021-02-26 DIAGNOSIS — Z7982 Long term (current) use of aspirin: Secondary | ICD-10-CM | POA: Insufficient documentation

## 2021-02-26 DIAGNOSIS — Z79899 Other long term (current) drug therapy: Secondary | ICD-10-CM | POA: Insufficient documentation

## 2021-02-26 DIAGNOSIS — Z85828 Personal history of other malignant neoplasm of skin: Secondary | ICD-10-CM | POA: Insufficient documentation

## 2021-02-26 DIAGNOSIS — R0902 Hypoxemia: Secondary | ICD-10-CM | POA: Diagnosis not present

## 2021-02-26 DIAGNOSIS — R58 Hemorrhage, not elsewhere classified: Secondary | ICD-10-CM | POA: Diagnosis not present

## 2021-02-26 DIAGNOSIS — L97519 Non-pressure chronic ulcer of other part of right foot with unspecified severity: Secondary | ICD-10-CM | POA: Insufficient documentation

## 2021-02-26 DIAGNOSIS — J449 Chronic obstructive pulmonary disease, unspecified: Secondary | ICD-10-CM | POA: Insufficient documentation

## 2021-02-26 DIAGNOSIS — L97909 Non-pressure chronic ulcer of unspecified part of unspecified lower leg with unspecified severity: Secondary | ICD-10-CM

## 2021-02-26 HISTORY — PX: LOWER EXTREMITY ANGIOGRAPHY: CATH118251

## 2021-02-26 LAB — BUN: BUN: 9 mg/dL (ref 8–23)

## 2021-02-26 LAB — CREATININE, SERUM
Creatinine, Ser: 0.68 mg/dL (ref 0.61–1.24)
GFR, Estimated: >60 mL/min (ref >60)

## 2021-02-26 SURGERY — LOWER EXTREMITY ANGIOGRAPHY
Anesthesia: Moderate Sedation | Site: Leg Lower | Laterality: Right

## 2021-02-26 MED ORDER — ONDANSETRON HCL 4 MG/2ML IJ SOLN: 4.0000 mg | Freq: Four times a day (QID) | INTRAMUSCULAR | Status: DC | PRN

## 2021-02-26 MED ORDER — ATORVASTATIN CALCIUM 10 MG PO TABS: 10.0000 mg | ORAL_TABLET | Freq: Every day | ORAL | Status: DC

## 2021-02-26 MED ORDER — DIPHENHYDRAMINE HCL 50 MG/ML IJ SOLN: 50.0000 mg | Freq: Once | INTRAMUSCULAR | Status: DC | PRN

## 2021-02-26 MED ORDER — LABETALOL HCL 5 MG/ML IV SOLN: 10.0000 mg | INTRAVENOUS | Status: DC | PRN

## 2021-02-26 MED ORDER — MIDAZOLAM HCL 2 MG/ML PO SYRP: 8.0000 mg | ORAL_SOLUTION | Freq: Once | ORAL | Status: DC | PRN

## 2021-02-26 MED ORDER — SODIUM CHLORIDE 0.9 % IV SOLN: INTRAVENOUS | Status: DC

## 2021-02-26 MED ORDER — SODIUM CHLORIDE 0.9% FLUSH: 3.0000 mL | Freq: Two times a day (BID) | INTRAVENOUS | Status: DC

## 2021-02-26 MED ORDER — MIDAZOLAM HCL 2 MG/2ML IJ SOLN
INTRAMUSCULAR | Status: DC | PRN
  Administered 2021-02-26 (×2): 2 mg via INTRAVENOUS

## 2021-02-26 MED ORDER — FENTANYL CITRATE (PF) 100 MCG/2ML IJ SOLN
INTRAMUSCULAR | Status: DC | PRN
  Administered 2021-02-26 (×2): 50 ug via INTRAVENOUS

## 2021-02-26 MED ORDER — FAMOTIDINE 20 MG PO TABS: 40.0000 mg | ORAL_TABLET | Freq: Once | ORAL | Status: DC | PRN

## 2021-02-26 MED ORDER — MIDAZOLAM HCL 2 MG/2ML IJ SOLN
INTRAMUSCULAR | Status: AC
  Filled 2021-02-26: qty 4

## 2021-02-26 MED ORDER — CEFAZOLIN SODIUM-DEXTROSE 2-4 GM/100ML-% IV SOLN
2.0000 g | Freq: Once | INTRAVENOUS | Status: AC
  Administered 2021-02-26: 2 g via INTRAVENOUS

## 2021-02-26 MED ORDER — SODIUM CHLORIDE 0.9 % IV SOLN: 250.0000 mL | INTRAVENOUS | Status: DC | PRN

## 2021-02-26 MED ORDER — HYDRALAZINE HCL 20 MG/ML IJ SOLN: 5.0000 mg | INTRAMUSCULAR | Status: DC | PRN

## 2021-02-26 MED ORDER — FENTANYL CITRATE PF 50 MCG/ML IJ SOSY
PREFILLED_SYRINGE | INTRAMUSCULAR | Status: AC
  Filled 2021-02-26: qty 1

## 2021-02-26 MED ORDER — METHYLPREDNISOLONE SODIUM SUCC 125 MG IJ SOLR: 125.0000 mg | Freq: Once | INTRAMUSCULAR | Status: DC | PRN

## 2021-02-26 MED ORDER — HYDROMORPHONE HCL 1 MG/ML IJ SOLN: 1.0000 mg | Freq: Once | INTRAMUSCULAR | Status: DC | PRN

## 2021-02-26 MED ORDER — ACETAMINOPHEN 325 MG PO TABS: 650.0000 mg | ORAL_TABLET | ORAL | Status: DC | PRN

## 2021-02-26 MED ORDER — IODIXANOL 320 MG/ML IV SOLN
INTRAVENOUS | Status: DC | PRN
  Administered 2021-02-26: 50 mL

## 2021-02-26 MED ORDER — HEPARIN SODIUM (PORCINE) 1000 UNIT/ML IJ SOLN
INTRAMUSCULAR | Status: AC
  Filled 2021-02-26: qty 1

## 2021-02-26 MED ORDER — HEPARIN SODIUM (PORCINE) 1000 UNIT/ML IJ SOLN
INTRAMUSCULAR | Status: DC | PRN
  Administered 2021-02-26: 4000 [IU] via INTRAVENOUS

## 2021-02-26 MED ORDER — SODIUM CHLORIDE 0.9% FLUSH: 3.0000 mL | INTRAVENOUS | Status: DC | PRN

## 2021-02-26 MED ORDER — ATORVASTATIN CALCIUM 10 MG PO TABS: 10.0000 mg | ORAL_TABLET | Freq: Every day | ORAL | 11 refills | Status: DC

## 2021-02-26 SURGICAL SUPPLY — 25 items
BALLN LUTONIX 018 4X300X130 (BALLOONS) ×3
BALLN LUTONIX 018 6X150X130 (BALLOONS) ×3
BALLN ULTRVRSE 2.5X300X150 (BALLOONS) ×3
BALLN ULTRVRSE 3X300X150 (BALLOONS) ×3
BALLN ULTRVRSE 3X300X150 OTW (BALLOONS) ×1
BALLOON LUTONIX 018 4X300X130 (BALLOONS) ×1 IMPLANT
BALLOON LUTONIX 018 6X150X130 (BALLOONS) ×1 IMPLANT
BALLOON ULTRVRSE 2.5X300X150 (BALLOONS) ×1 IMPLANT
BALLOON ULTRVRSE 3X300X150 OTW (BALLOONS) ×1 IMPLANT
CATH ANGIO 5F PIGTAIL 65CM (CATHETERS) ×3 IMPLANT
CATH BEACON 5 .038 100 VERT TP (CATHETERS) ×3 IMPLANT
CATH NAVICROSS ANGLED 135CM (MICROCATHETER) ×3 IMPLANT
CATH TEMPO 5F RIM 65CM (CATHETERS) ×3 IMPLANT
COVER PROBE U/S 5X48 (MISCELLANEOUS) ×3 IMPLANT
DEVICE STARCLOSE SE CLOSURE (Vascular Products) ×3 IMPLANT
DEVICE TORQUE .025-.038 (MISCELLANEOUS) ×3 IMPLANT
GLIDEWIRE ADV .035X260CM (WIRE) ×3 IMPLANT
KIT ENCORE 26 ADVANTAGE (KITS) ×3 IMPLANT
PACK ANGIOGRAPHY (CUSTOM PROCEDURE TRAY) ×3 IMPLANT
SHEATH BRITE TIP 5FRX11 (SHEATH) ×3 IMPLANT
SHEATH RAABE 6FRX70 (SHEATH) ×3 IMPLANT
SYR MEDRAD MARK 7 150ML (SYRINGE) ×3 IMPLANT
TUBING CONTRAST HIGH PRESS 72 (TUBING) ×3 IMPLANT
WIRE G V18X300CM (WIRE) ×3 IMPLANT
WIRE GUIDERIGHT .035X150 (WIRE) ×3 IMPLANT

## 2021-02-26 NOTE — ED Provider Notes (Signed)
-----------------------------------------   11:00 PM on 02/26/2021 -----------------------------------------  Assuming care from Dr. Vladimir Crofts.  In short, Derek Henderson is a 75 y.o. male with a chief complaint of bleeding from inguinal catheterization site.  Refer to the original H&P for additional details.  The current plan of care is to observe for a little while longer and, if no expansion of hematoma, discharged for outpatient follow-up.   ----------------------------------------- 12:49 AM on 02/27/2021 -----------------------------------------  Patient has been stable with no additional bleeding or swelling.  He is comfortable with the plan for discharge and outpatient follow-up with vascular.  I gave my usual and customary return precautions and paperwork written by Dr. Tamala Julian.   Hinda Kehr, MD 02/27/21 [phone]
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
## 2021-02-26 NOTE — Discharge Instructions (Addendum)
Wait until tomorrow to gently remove the bandage on your left groin.  Call Dr. Bunnie Domino office for close follow-up.  If you have any further bleeding, like we talked about, press firmly with your fingers and hold directly over the bleeding area.  It will be sore and hurt, but using direct pressure is the best way to stop the bleeding.  If this does not work, call 911 again.

## 2021-02-26 NOTE — ED Provider Notes (Signed)
[facility] Emergency Department Provider Note ____________________________________________   Event Date/Time   First MD Initiated Contact with Patient 02/26/21 2132     (approximate)  I have reviewed the triage vital signs and the nursing notes.  HISTORY  Chief Complaint Post-op Problem   HPI Derek Henderson is a 75 y.o. malewho presents to the ED for evaluation of postop bleeding.   Chart review indicates history of PAD with wounds to his right foot and had percutaneous procedure earlier today with Dr. Lucky Cowboy.  Left femoral approach with angiogram, angioplasty of right lower extremity arteries.  DAPT without anticoagulation.  Patient presents to the ED via EMS from home, and accompanied by his wife, for evaluation of bleeding to his left-sided femoral access site.  He reports he was doing fine after getting home, but after bending at the waist to get to and from the toilet this evening he felt a sudden rush of warm blood on his groin and leg.  He applied pressure and they called 911.  The time he arrived to the ED bleeding has stopped.  Denies any falls, trauma or syncope.  Denies weakness or sensation changes to his left leg or foot.  Reports concerned that he might bleed again at home.  Past Medical History:  Diagnosis Date   Cancer ([place])    melanoma   COPD (chronic obstructive pulmonary disease) ([place])    Emphysema lung ([facility])    History of malignant melanoma    Mild obstructive sleep apnea    can not afford CPAP machine   PAD (peripheral artery disease) ([place])    Panlobular emphysema ([place])    Tobacco abuse     Patient Active Problem List   Diagnosis Date Noted   SDH (subdural hematoma) 01/09/2021   Benign essential hypertension 05/31/2019   Leg pain 12/10/2017   Atherosclerosis of native arteries of extremity with intermittent claudication ([place]) 03/10/2017   Peripheral artery disease ([place]) 02/05/2017   Pulmonary nodules 11/20/2016   Medicare  annual wellness visit, initial 11/20/2016   History of malignant melanoma 04/18/2016   Mild obstructive sleep apnea 04/18/2016   Panlobular emphysema ([place]) 04/18/2016   Tobacco abuse 04/18/2016    Past Surgical History:  Procedure Laterality Date   HERNIA REPAIR Left    LOWER EXTREMITY ANGIOGRAPHY Left 02/11/2017   Procedure: Lower Extremity Angiography;  Surgeon: Katha Cabal, MD;  Location: [facility];  Service: Cardiovascular;  Laterality: Left;   LOWER EXTREMITY INTERVENTION  02/11/2017   Procedure: LOWER EXTREMITY INTERVENTION;  Surgeon: Katha Cabal, MD;  Location: [facility];  Service: Cardiovascular;;   MELANOMA EXCISION Right 2010    Prior to Admission medications   Medication Sig Start Date End Date Taking? Authorizing Provider  acetaminophen (TYLENOL) 325 MG tablet Take 2 tablets (650 mg total) by mouth every 6 (six) hours as needed for mild pain. 01/16/21   Meuth, Brooke A, PA-C  albuterol (VENTOLIN HFA) 108 (90 Base) MCG/ACT inhaler Inhale 1-2 puffs into the lungs every 6 (six) hours as needed for shortness of breath (use when you have trouble breathing.). 12/10/18   Laverle Hobby, MD  aspirin EC 81 MG tablet Take 1 tablet (81 mg total) by mouth daily. 02/12/17   Schnier, Dolores Lory, MD  atorvastatin (LIPITOR) 10 MG tablet Take 1 tablet (10 mg total) by mouth daily. 02/26/21 02/26/22  Algernon Huxley, MD  Cholecalciferol (VITAMIN D3 PO) Take 1 tablet by mouth daily.  [provider]  clopidogrel (PLAVIX) 75 MG tablet TAKE 1 TABLET EVERY DAY 04/20/19   Schnier, Dolores Lory, MD    Allergies Codeine  Family History  Problem Relation Age of Onset   Cancer Mother        lung and breast   Tuberculosis Mother    Cancer Father        throat   Cancer Sister        stomach   Cancer Brother        Brain    Cancer Sister        lung   Cancer Brother        lung    Social History Social History   Tobacco Use   Smoking status:  Former    Packs/day: 1.00    Years: 45.00    Pack years: 45.00    Types: Cigarettes    Quit date: 01/09/2021    Years since quitting: 0.1   Smokeless tobacco: Current    Types: Chew  Vaping Use   Vaping Use: Never used  Substance Use Topics   Alcohol use: Yes    Comment: rare   Drug use: No    Review of Systems  Constitutional: No fever/chills Eyes: No visual changes. ENT: No sore throat. Cardiovascular: Denies chest pain. Positive for bleeding left femoral access site Respiratory: Denies shortness of breath. Gastrointestinal: No abdominal pain.  No nausea, no vomiting.  No diarrhea.  No constipation. Genitourinary: Negative for dysuria. Musculoskeletal: Negative for back pain.  Skin: Negative for rash. Neurological: Negative for headaches, focal weakness or numbness.  ____________________________________________   PHYSICAL EXAM:  VITAL SIGNS: Vitals:   02/26/21 2134  BP: (!) 158/68  Pulse: 71  Resp: 16  Temp: 98.3 F (36.8 C)  SpO2: 96%     Constitutional: Alert and oriented. Well appearing and in no acute distress. Eyes: Conjunctivae are normal. PERRL. EOMI. Head: Atraumatic. Nose: No congestion/rhinnorhea. Mouth/Throat: Mucous membranes are moist.  Oropharynx non-erythematous. Neck: No stridor. No cervical spine tenderness to palpation. Cardiovascular: Normal rate, regular rhythm. Grossly normal heart sounds.  Good peripheral circulation. Respiratory: Normal respiratory effort.  No retractions. Lungs CTAB. Gastrointestinal: Soft , nondistended, nontender to palpation. No CVA tenderness. Musculoskeletal: No lower extremity tenderness nor edema.  No joint effusions. No signs of acute trauma.  Gauze with overlying Tegaderm to left femoral access site as pictured below.  Gauze is saturated with blood, dried blood throughout the groin and leg around this, but no signs of active bleeding.  There is a fullness just medial to the access site with a likely  subcutaneous hematoma.  Couple centimeters in diameter.  Neurologic:  Normal speech and language. No gross focal neurologic deficits are appreciated. No gait instability noted. Skin:  Skin is warm, dry and intact. No rash noted. Psychiatric: Mood and affect are normal. Speech and behavior are normal.     ____________________________________________   LABS (all labs ordered are listed, but only abnormal results are displayed)  Labs Reviewed - No data to display ____________________________________________  12 Lead EKG   ____________________________________________  RADIOLOGY  ED MD interpretation:    Official radiology report(s): PERIPHERAL VASCULAR CATHETERIZATION  Result Date: 02/26/2021 See surgical note for result.   ____________________________________________   PROCEDURES and INTERVENTIONS  Procedure(s) performed (including Critical Care):  Procedures  Medications - No data to display  ____________________________________________   MDM / ED COURSE   75 year old male presents to the ED for evaluation of postoperative bleeding after recent  lower extremity angiography.  Bleeding resolved for the time he presents to the ED.  He has no tachycardia, evidence of neurologic or vascular deficits to the affected leg.  He looks clinically well.  Blood saturated bandage, as above, with small associated hematoma, but no evidence of active bleeding or concerning neurovascular compression.  We will observe in the ED for couple hours to ensure no rebleeding, discussed management at home and close return precautions.  Anticipate he be suitable for outpatient management.  Signed out to oncoming provider to follow-up on the patient after at least 2 hours in the ED with anticipation of outpatient management as long as there is no rebleeding or concerns.  Clinical Course as of 02/26/21 2315  Mon Feb 26, 2021  2247 Reassessed.  No bleeding. [DS]    Clinical Course User  Index [DS] Vladimir Crofts, MD    ____________________________________________   FINAL CLINICAL IMPRESSION(S) / ED DIAGNOSES  Final diagnoses:  Visit for wound check  Acute post-operative pain     ED Discharge Orders     None        Levora Werden Tamala Julian   Note:  This document was prepared using Dragon voice recognition software and may include unintentional dictation errors.    Vladimir Crofts, MD 02/26/21 [phone]

## 2021-02-26 NOTE — Op Note (Signed)
[facility]  Percutaneous Study/Intervention Procedural Note   Date of Surgery: 02/26/2021  Surgeon(s):Astou Lada    Assistants:none  Pre-operative Diagnosis: PAD with ulceration and gangrenous changes right foot  Post-operative diagnosis:  Same  Procedure(s) Performed:             1.  Ultrasound guidance for vascular access left femoral artery             2.  Catheter placement into right common femoral artery from left femoral approach             3.  Aortogram and selective right lower extremity angiogram             4.  Percutaneous transluminal angioplasty of right posterior tibial artery with 4 mm diameter by 30 cm length Lutonix drug-coated angioplasty balloon from the tibioperoneal trunk to the mid to distal posterior tibial artery and then a 3 mm diameter by 30 cm length balloon from the foot to the mid posterior tibial artery             5.  Percutaneous transluminal angioplasty of the right distal SFA and above-knee popliteal artery with 6 mm diameter by 15 cm length Lutonix drug-coated angioplasty balloon  6.  Percutaneous transluminal angioplasty of the right anterior tibial artery with 2.5 mm diameter by 30 cm length angioplasty balloon             7.  StarClose closure device left femoral artery  EBL: 10 cc  Contrast: 50 cc  Fluoro Time: 8.7 minutes  Moderate Conscious Sedation Time: approximately 46 minutes using 4 mg of Versed and 100 mcg of Fentanyl              Indications:  Patient is a 75 y.o.male with gangrenous changes and nonhealing ulcerations on the right foot after trauma. The patient has noninvasive study showing reduced digital pressure worse on the right than the left. The patient is brought in for angiography for further evaluation and potential treatment.  Due to the limb threatening nature of the situation, angiogram was performed for attempted limb salvage. The patient is aware that if the procedure fails, amputation would be  expected.  The patient also understands that even with successful revascularization, amputation may still be required due to the severity of the situation.  Risks and benefits are discussed and informed consent is obtained.   Procedure:  The patient was identified and appropriate procedural time out was performed.  The patient was then placed supine on the table and prepped and draped in the usual sterile fashion. Moderate conscious sedation was administered during a face to face encounter with the patient throughout the procedure with my supervision of the RN administering medicines and monitoring the patient's vital signs, pulse oximetry, telemetry and mental status throughout from the start of the procedure until the patient was taken to the recovery room. Ultrasound was used to evaluate the left common femoral artery.  It was patent .  A digital ultrasound image was acquired.  A Seldinger needle was used to access the left common femoral artery under direct ultrasound guidance and a permanent image was performed.  A 0.035 J wire was advanced without resistance and a 5Fr sheath was placed.  Pigtail catheter was placed into the aorta and an AP aortogram was performed. This demonstrated normal renal arteries and a small aneurysm of the abdominal aorta.  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  There were no focal stenosis in either iliac artery. I then crossed the aortic bifurcation and advanced to the right femoral head. Selective right lower extremity angiogram was then performed. This demonstrated normal common femoral artery, profunda femoris artery, and proximal and mid superficial femoral arteries.  The distal SFA and above-knee popliteal artery through Hunter's canal had moderate disease in the 60 to 70% range.  The below-knee popliteal artery then normalized.  There was a typical tibial trifurcation.  The posterior  tibial artery was a very large vessel with multiple areas of high-grade stenosis and a surprising amount of tortuosity and irregularity throughout the vessel.  It was occluded at the ankle but did reconstitute just beyond a short segment occlusion with flow into the foot.  The peroneal artery was small but did not have focal stenosis.  The anterior tibial artery had severe disease and possible short segment occlusion throughout the proximal and mid segment and then occluded at the ankle. It was felt that it was in the patient's best interest to proceed with intervention after these images to avoid a second procedure and a larger amount of contrast and fluoroscopy based off of the findings from the initial angiogram. The patient was systemically heparinized and a 6 French 70 cm sheath was then placed over the Terumo Advantage wire. I then used a Kumpe catheter and the advantage wire to navigate through the SFA and popliteal lesion down into the posterior tibial artery where I exchanged for a V 18 wire.  Using a Ferd Hibbs cross catheter I was able to cross all the areas of stenosis and the occlusion at the ankle and parked the wire in the forefoot.  A 4 mm diameter by 30 cm length Lutonix drug-coated angioplasty balloon was used to treat the tibioperoneal trunk and the proximal and mid posterior tibial arteries down to the mid to distal posterior tibial artery.  This is inflated to 6 atm for 1 minute.  3 mm diameter by 30 cm length angioplasty balloon was then used to treat from the foot up to the mid posterior tibial artery inflating this up to 10 atm for 1 minute in the posterior tibial artery.  Completion imaging showed some spasm, but no clear greater than 25% residual stenosis.  The distal SFA and above-knee popliteal artery were then treated with a 6 mm diameter by 15 cm length Lutonix drug-coated angioplasty balloon inflated to 10 atm for 1 minute.  Completion imaging showed only about a 10 to 15% residual stenosis  after angioplasty so no stent was placed.  I then cannulated the anterior tibial artery with the Kumpe catheter and the V 18 wire which was taken down to the ankle where the vessel terminated.  Felt it was useful to treat the proximal and mid disease to improve collateral flow distally as well.  A 2.5 mm diameter by 30 cm length angioplasty balloon was inflated from the anterior tibial artery at its origin down to the distal portion of the anterior tibial artery and taken up to 12 atm for 1 minute.  Completion imaging showed no greater than 20% residual stenosis in the areas treated although it was still occluded in the foot. I elected to terminate the procedure. The sheath was removed and StarClose closure device was deployed in the left femoral artery with excellent hemostatic result. The patient was taken to the recovery room in stable condition having tolerated the procedure well.  Findings:  Aortogram:  This demonstrated normal renal arteries and a small aneurysm of the abdominal aorta.  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  There were no focal stenosis in either iliac artery.             Right lower Extremity: This demonstrated normal common femoral artery, profunda femoris artery, and proximal and mid superficial femoral arteries.  The distal SFA and above-knee popliteal artery through Hunter's canal had moderate disease in the 60 to 70% range.  The below-knee popliteal artery then normalized.  There was a typical tibial trifurcation.  The posterior tibial artery was a very large vessel with multiple areas of high-grade stenosis and a surprising amount of tortuosity and irregularity throughout the vessel.  It was occluded at the ankle but did reconstitute just beyond a short segment occlusion with flow into the foot.  The peroneal artery was small but did not have focal stenosis.  The anterior tibial  artery had severe disease and possible short segment occlusion throughout the proximal and mid segment and then occluded at the ankle.   Disposition: Patient was taken to the recovery room in stable condition having tolerated the procedure well.  Complications: None  Derek Henderson 02/26/2021 3:31 PM   This note was created with Dragon Medical transcription system. Any errors in dictation are purely unintentional.

## 2021-02-27 ENCOUNTER — Telehealth (INDEPENDENT_AMBULATORY_CARE_PROVIDER_SITE_OTHER): Payer: Self-pay | Admitting: Vascular Surgery

## 2021-02-27 ENCOUNTER — Encounter: Payer: Self-pay | Admitting: Vascular Surgery

## 2021-02-27 NOTE — Telephone Encounter (Signed)
Please advise Dr. Lucky Cowboy did the procedure on 02/26/21 [place] angio  pt ended up at ED  needs to follow up here per ED.

## 2021-02-27 NOTE — Telephone Encounter (Signed)
Please schedule per NP.

## 2021-02-27 NOTE — Telephone Encounter (Signed)
He can come in, in about q week or so with ABIs... me or dew,

## 2021-02-28 ENCOUNTER — Other Ambulatory Visit (INDEPENDENT_AMBULATORY_CARE_PROVIDER_SITE_OTHER): Payer: Self-pay | Admitting: Vascular Surgery

## 2021-02-28 DIAGNOSIS — I739 Peripheral vascular disease, unspecified: Secondary | ICD-10-CM

## 2021-02-28 DIAGNOSIS — Z9862 Peripheral vascular angioplasty status: Secondary | ICD-10-CM

## 2021-03-01 ENCOUNTER — Other Ambulatory Visit: Payer: Self-pay

## 2021-03-01 ENCOUNTER — Ambulatory Visit (INDEPENDENT_AMBULATORY_CARE_PROVIDER_SITE_OTHER): Payer: Medicare HMO

## 2021-03-01 ENCOUNTER — Ambulatory Visit (INDEPENDENT_AMBULATORY_CARE_PROVIDER_SITE_OTHER): Payer: Medicare HMO | Admitting: Nurse Practitioner

## 2021-03-01 VITALS — BP 203/85 | HR 87 | Ht 74.0 in | Wt 124.0 lb

## 2021-03-01 DIAGNOSIS — I10 Essential (primary) hypertension: Secondary | ICD-10-CM | POA: Diagnosis not present

## 2021-03-01 DIAGNOSIS — I70234 Atherosclerosis of native arteries of right leg with ulceration of heel and midfoot: Secondary | ICD-10-CM | POA: Diagnosis not present

## 2021-03-01 DIAGNOSIS — Z720 Tobacco use: Secondary | ICD-10-CM | POA: Diagnosis not present

## 2021-03-01 DIAGNOSIS — Z9862 Peripheral vascular angioplasty status: Secondary | ICD-10-CM | POA: Diagnosis not present

## 2021-03-01 DIAGNOSIS — I739 Peripheral vascular disease, unspecified: Secondary | ICD-10-CM | POA: Diagnosis not present

## 2021-03-01 NOTE — Interval H&P Note (Signed)
History and Physical Interval Note:  03/01/2021 3:27 PM  Derek Henderson  has presented today for surgery, with the diagnosis of RLE Angio   BARD   ASO w ulceration.  The various methods of treatment have been discussed with the patient and family. After consideration of risks, benefits and other options for treatment, the patient has consented to  Procedure(s): LOWER EXTREMITY ANGIOGRAPHY (Right) as a surgical intervention.  The patient's history has been reviewed, patient examined, no change in status, stable for surgery.  I have reviewed the patient's chart and labs.  Questions were answered to the patient's satisfaction.     Leotis Pain

## 2021-03-04 ENCOUNTER — Encounter (INDEPENDENT_AMBULATORY_CARE_PROVIDER_SITE_OTHER): Payer: Self-pay | Admitting: Nurse Practitioner
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
## 2021-03-27 ENCOUNTER — Encounter (INDEPENDENT_AMBULATORY_CARE_PROVIDER_SITE_OTHER): Payer: Medicare HMO

## 2021-03-27 ENCOUNTER — Encounter (INDEPENDENT_AMBULATORY_CARE_PROVIDER_SITE_OTHER): Payer: Medicare HMO | Admitting: Nurse Practitioner

## 2021-03-28 DIAGNOSIS — J432 Centrilobular emphysema: Secondary | ICD-10-CM | POA: Diagnosis not present

## 2021-03-28 DIAGNOSIS — S91301D Unspecified open wound, right foot, subsequent encounter: Secondary | ICD-10-CM | POA: Diagnosis not present

## 2021-03-28 DIAGNOSIS — S0240DD Maxillary fracture, left side, subsequent encounter for fracture with routine healing: Secondary | ICD-10-CM | POA: Diagnosis not present

## 2021-03-28 DIAGNOSIS — S0232XD Fracture of orbital floor, left side, subsequent encounter for fracture with routine healing: Secondary | ICD-10-CM | POA: Diagnosis not present

## 2021-03-28 DIAGNOSIS — D630 Anemia in neoplastic disease: Secondary | ICD-10-CM | POA: Diagnosis not present

## 2021-03-28 DIAGNOSIS — C641 Malignant neoplasm of right kidney, except renal pelvis: Secondary | ICD-10-CM | POA: Diagnosis not present

## 2021-03-28 DIAGNOSIS — S0240FD Zygomatic fracture, left side, subsequent encounter for fracture with routine healing: Secondary | ICD-10-CM | POA: Diagnosis not present

## 2021-03-29 DIAGNOSIS — L97512 Non-pressure chronic ulcer of other part of right foot with fat layer exposed: Secondary | ICD-10-CM | POA: Diagnosis not present

## 2021-03-29 DIAGNOSIS — S92011D Displaced fracture of body of right calcaneus, subsequent encounter for fracture with routine healing: Secondary | ICD-10-CM | POA: Diagnosis not present

## 2021-04-03 DIAGNOSIS — Z8781 Personal history of (healed) traumatic fracture: Secondary | ICD-10-CM | POA: Diagnosis not present

## 2021-04-03 DIAGNOSIS — D630 Anemia in neoplastic disease: Secondary | ICD-10-CM | POA: Diagnosis not present

## 2021-04-03 DIAGNOSIS — S2243XD Multiple fractures of ribs, bilateral, subsequent encounter for fracture with routine healing: Secondary | ICD-10-CM | POA: Diagnosis not present

## 2021-04-03 DIAGNOSIS — M4852XA Collapsed vertebra, not elsewhere classified, cervical region, initial encounter for fracture: Secondary | ICD-10-CM | POA: Diagnosis not present

## 2021-04-03 DIAGNOSIS — S91301D Unspecified open wound, right foot, subsequent encounter: Secondary | ICD-10-CM | POA: Diagnosis not present

## 2021-04-03 DIAGNOSIS — C641 Malignant neoplasm of right kidney, except renal pelvis: Secondary | ICD-10-CM | POA: Diagnosis not present

## 2021-04-03 DIAGNOSIS — R29890 Loss of height: Secondary | ICD-10-CM | POA: Diagnosis not present

## 2021-04-03 DIAGNOSIS — S12600D Unspecified displaced fracture of seventh cervical vertebra, subsequent encounter for fracture with routine healing: Secondary | ICD-10-CM | POA: Diagnosis not present

## 2021-04-03 DIAGNOSIS — S065XAD Traumatic subdural hemorrhage with loss of consciousness status unknown, subsequent encounter: Secondary | ICD-10-CM | POA: Diagnosis not present

## 2021-04-03 DIAGNOSIS — S12500D Unspecified displaced fracture of sixth cervical vertebra, subsequent encounter for fracture with routine healing: Secondary | ICD-10-CM | POA: Diagnosis not present

## 2021-05-01 DIAGNOSIS — L97512 Non-pressure chronic ulcer of other part of right foot with fat layer exposed: Secondary | ICD-10-CM | POA: Diagnosis not present

## 2021-05-16 ENCOUNTER — Ambulatory Visit: Payer: Medicare HMO | Admitting: Urology

## 2021-05-25 ENCOUNTER — Ambulatory Visit
Admission: RE | Admit: 2021-05-25 | Discharge: 2021-05-25 | Disposition: A | Discharge status: Home / Self Care | Payer: Medicare HMO | Source: Ambulatory Visit | Attending: Urology | Admitting: Urology

## 2021-05-25 ENCOUNTER — Other Ambulatory Visit: Payer: Self-pay

## 2021-05-25 DIAGNOSIS — N2889 Other specified disorders of kidney and ureter: Secondary | ICD-10-CM | POA: Diagnosis not present

## 2021-05-25 DIAGNOSIS — I723 Aneurysm of iliac artery: Secondary | ICD-10-CM | POA: Diagnosis not present

## 2021-05-25 DIAGNOSIS — I77811 Abdominal aortic ectasia: Secondary | ICD-10-CM | POA: Diagnosis not present

## 2021-05-25 DIAGNOSIS — I700 Atherosclerosis of aorta: Secondary | ICD-10-CM | POA: Diagnosis not present

## 2021-05-25 LAB — POCT I-STAT CREATININE: Creatinine, Ser: 0.9 mg/dL (ref 0.61–1.24)

## 2021-05-25 MED ORDER — IOHEXOL 300 MG/ML  SOLN
100.0000 mL | Freq: Once | INTRAMUSCULAR | Status: AC | PRN
  Administered 2021-05-25: 100 mL via INTRAVENOUS

## 2021-05-29 ENCOUNTER — Ambulatory Visit: Payer: Medicare HMO | Admitting: Urology
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
## 2021-05-30 ENCOUNTER — Other Ambulatory Visit: Payer: Self-pay

## 2021-05-30 ENCOUNTER — Ambulatory Visit: Payer: Medicare HMO | Admitting: Urology

## 2021-05-30 VITALS — BP 125/86 | HR 111 | Ht 73.0 in | Wt 153.0 lb

## 2021-05-30 DIAGNOSIS — N2889 Other specified disorders of kidney and ureter: Secondary | ICD-10-CM

## 2021-05-30 DIAGNOSIS — J431 Panlobular emphysema: Secondary | ICD-10-CM | POA: Diagnosis not present

## 2021-05-30 DIAGNOSIS — I739 Peripheral vascular disease, unspecified: Secondary | ICD-10-CM | POA: Diagnosis not present

## 2021-05-30 DIAGNOSIS — Z125 Encounter for screening for malignant neoplasm of prostate: Secondary | ICD-10-CM | POA: Diagnosis not present

## 2021-05-30 DIAGNOSIS — Z79899 Other long term (current) drug therapy: Secondary | ICD-10-CM | POA: Diagnosis not present

## 2021-05-31 ENCOUNTER — Other Ambulatory Visit (INDEPENDENT_AMBULATORY_CARE_PROVIDER_SITE_OTHER): Payer: Self-pay | Admitting: Nurse Practitioner

## 2021-05-31 DIAGNOSIS — I739 Peripheral vascular disease, unspecified: Secondary | ICD-10-CM

## 2021-05-31 DIAGNOSIS — L97512 Non-pressure chronic ulcer of other part of right foot with fat layer exposed: Secondary | ICD-10-CM | POA: Diagnosis not present

## 2021-05-31 DIAGNOSIS — Z9889 Other specified postprocedural states: Secondary | ICD-10-CM

## 2021-05-31 DIAGNOSIS — S92011D Displaced fracture of body of right calcaneus, subsequent encounter for fracture with routine healing: Secondary | ICD-10-CM | POA: Diagnosis not present

## 2021-06-01 ENCOUNTER — Ambulatory Visit (INDEPENDENT_AMBULATORY_CARE_PROVIDER_SITE_OTHER): Payer: Medicare HMO

## 2021-06-01 ENCOUNTER — Ambulatory Visit (INDEPENDENT_AMBULATORY_CARE_PROVIDER_SITE_OTHER): Payer: Medicare HMO | Admitting: Nurse Practitioner

## 2021-06-01 ENCOUNTER — Other Ambulatory Visit: Payer: Self-pay

## 2021-06-01 VITALS — BP 155/86 | HR 56 | Ht 73.0 in | Wt 156.0 lb

## 2021-06-01 DIAGNOSIS — I739 Peripheral vascular disease, unspecified: Secondary | ICD-10-CM

## 2021-06-01 DIAGNOSIS — I10 Essential (primary) hypertension: Secondary | ICD-10-CM

## 2021-06-01 DIAGNOSIS — Z9889 Other specified postprocedural states: Secondary | ICD-10-CM

## 2021-06-01 DIAGNOSIS — Z720 Tobacco use: Secondary | ICD-10-CM

## 2021-06-05 DIAGNOSIS — C649 Malignant neoplasm of unspecified kidney, except renal pelvis: Secondary | ICD-10-CM | POA: Diagnosis not present

## 2021-06-05 DIAGNOSIS — J449 Chronic obstructive pulmonary disease, unspecified: Secondary | ICD-10-CM | POA: Diagnosis not present

## 2021-06-05 DIAGNOSIS — Z720 Tobacco use: Secondary | ICD-10-CM | POA: Diagnosis not present

## 2021-06-05 DIAGNOSIS — I739 Peripheral vascular disease, unspecified: Secondary | ICD-10-CM | POA: Diagnosis not present

## 2021-06-05 DIAGNOSIS — Z0000 Encounter for general adult medical examination without abnormal findings: Secondary | ICD-10-CM | POA: Diagnosis not present

## 2021-06-05 DIAGNOSIS — Z1389 Encounter for screening for other disorder: Secondary | ICD-10-CM | POA: Diagnosis not present

## 2021-06-09 ENCOUNTER — Encounter (INDEPENDENT_AMBULATORY_CARE_PROVIDER_SITE_OTHER): Payer: Self-pay | Admitting: Nurse Practitioner

## 2021-06-09 NOTE — Progress Notes (Signed)
Subjective:    Patient ID: Derek Henderson, male    DOB: 09/25/1945, 76 y.o.   MRN: [id number] Chief Complaint  Patient presents with   Follow-up    3 mo post op with ABI LE arterial     Lem Peary is a 76 year old male that returns to the office for followup and review of the noninvasive studies. There have been no interval changes in lower extremity symptoms. No interval shortening of the patient's claudication distance or development of rest pain symptoms.  The patient's previous wounds are very nearly healed and there is just a small area left open.  He has been getting excellent wound care by the wound care center.  No new ulcers or wounds have occurred since the last visit.  There have been no significant changes to the patient's overall health care.  The patient denies amaurosis fugax or recent TIA symptoms. There are no recent neurological changes noted. The patient denies history of DVT, PE or superficial thrombophlebitis. The patient denies recent episodes of angina or shortness of breath.   ABI Rt=1.04 and Lt=1.04  (previous ABI's Rt=1.22 and Lt=1.18) Duplex ultrasound of the right lower extremity has monophasic/biphasic waveforms with normal toe waveforms.  The patient has biphasic waveforms in the left tibial arteries.  He also has good toe waveforms on the left toe.   Review of Systems  Skin:  Positive for wound.  All other systems reviewed and are negative.     Objective:   Physical Exam Vitals reviewed.  HENT:     Head: Normocephalic.  Cardiovascular:     Rate and Rhythm: Normal rate.     Pulses: Decreased pulses.  Pulmonary:     Effort: Pulmonary effort is normal.  Skin:    General: Skin is warm and dry.  Neurological:     Mental Status: He is alert and oriented to person, place, and time.  Psychiatric:        Mood and Affect: Mood normal.        Behavior: Behavior normal.        Thought Content: Thought content normal.        Judgment: Judgment  normal.    BP (!) 155/86    Pulse (!) 56    Ht 6\' 1"  (1.854 m)    Wt 156 lb (70.8 kg)    BMI 20.58 kg/m   Past Medical History:  Diagnosis Date   Cancer ([employer])    melanoma   COPD (chronic obstructive pulmonary disease) (HCC)    Emphysema lung (HCC)    History of malignant melanoma    Mild obstructive sleep apnea    can not afford CPAP machine   PAD (peripheral artery disease) (HCC)    Panlobular emphysema (HCC)    Tobacco abuse     Social History   Socioeconomic History   Marital status: Married    Spouse name: Izora Gala   Number of children: 7   Years of education: Not on file   Highest education level: Not on file  Occupational History   Not on file  Tobacco Use   Smoking status: Former    Packs/day: 1.00    Years: 45.00    Pack years: 45.00    Types: Cigarettes    Quit date: 01/09/2021    Years since quitting: 0.4   Smokeless tobacco: Current    Types: Chew  Vaping Use   Vaping Use: Never used  Substance and Sexual Activity   Alcohol use: Yes  Comment: rare   Drug use: No   Sexual activity: Not on file  Other Topics Concern   Not on file  Social History Narrative   Lives at home with spouse [place]    Social Determinants of Health   Financial Resource Strain: Not on file  Food Insecurity: Not on file  Transportation Needs: Not on file  Physical Activity: Not on file  Stress: Not on file  Social Connections: Not on file  Intimate Partner Violence: Not on file    Past Surgical History:  Procedure Laterality Date   HERNIA REPAIR Left    LOWER EXTREMITY ANGIOGRAPHY Left 02/11/2017   Procedure: Lower Extremity Angiography;  Surgeon: Katha Cabal, MD;  Location: [facility];  Service: Cardiovascular;  Laterality: Left;   LOWER EXTREMITY ANGIOGRAPHY Right 02/26/2021   Procedure: LOWER EXTREMITY ANGIOGRAPHY;  Surgeon: Algernon Huxley, MD;  Location: [facility];  Service: Cardiovascular;  Laterality: Right;   LOWER EXTREMITY INTERVENTION   02/11/2017   Procedure: LOWER EXTREMITY INTERVENTION;  Surgeon: Katha Cabal, MD;  Location: [facility];  Service: Cardiovascular;;   MELANOMA EXCISION Right 2010    Family History  Problem Relation Age of Onset   Cancer Mother        lung and breast   Tuberculosis Mother    Cancer Father        throat   Cancer Sister        stomach   Cancer Brother        Brain    Cancer Sister        lung   Cancer Brother        lung    Allergies  Allergen Reactions   Codeine Other (See Comments)    Headache     CBC Latest Ref Rng & Units 01/15/2021 01/13/2021 01/12/2021  WBC 4.0 - 10.5 K/uL 10.3 - 8.3  Hemoglobin 13.0 - 17.0 g/dL 11.2(L) 11.6(L) 11.4(L)  Hematocrit 39.0 - 52.0 % 33.2(L) 34.0(L) 34.3(L)  Platelets 150 - 400 K/uL 282 - 183      CMP     Component Value Date/Time   NA 130 (L) 01/15/2021 0200   K 3.8 01/15/2021 0200   CL 95 (L) 01/15/2021 0200   CO2 28 01/15/2021 0200   GLUCOSE 116 (H) 01/15/2021 0200   BUN 9 02/26/2021 1157   CREATININE 0.90 05/25/2021 0913   CALCIUM 9.4 01/15/2021 0200   PROT 6.1 (L) 01/09/2021 1414   ALBUMIN 3.8 01/09/2021 1414   AST 29 01/09/2021 1414   ALT 21 01/09/2021 1414   ALKPHOS 59 01/09/2021 1414   BILITOT 1.0 01/09/2021 1414   GFRNONAA >60 02/26/2021 1157   GFRAA >60 02/10/2017 0802     No results found.     Assessment & Plan:   1. Peripheral artery disease (HCC)  Recommend:  The patient has evidence of atherosclerosis of the lower extremities with claudication.  The patient does not voice lifestyle limiting changes at this point in time.  The patient's wound is nearly healed.  Noninvasive studies do not suggest clinically significant change.  No invasive studies, angiography or surgery at this time The patient should continue walking and begin a more formal exercise program.  The patient should continue antiplatelet therapy and aggressive treatment of the lipid abnormalities  No changes in the  patient's medications at this time  The patient should continue wearing graduated compression socks 10-15 mmHg strength to control the mild edema.  2. Tobacco abuse Smoking cessation was discussed, 3-10 minutes spent on this topic specifically   3. Benign essential hypertension Continue antihypertensive medications as already ordered, these medications have been reviewed and there are no changes at this time.    Current Outpatient Medications on File Prior to Visit  Medication Sig Dispense Refill   acetaminophen (TYLENOL) 325 MG tablet Take 2 tablets (650 mg total) by mouth every 6 (six) hours as needed for mild pain.     albuterol (VENTOLIN HFA) 108 (90 Base) MCG/ACT inhaler Inhale 1-2 puffs into the lungs every 6 (six) hours as needed for shortness of breath (use when you have trouble breathing.). 1 g 3   aspirin EC 81 MG tablet Take 1 tablet (81 mg total) by mouth daily. 150 tablet 2   atorvastatin (LIPITOR) 10 MG tablet Take 1 tablet (10 mg total) by mouth daily. 30 tablet 11   Cholecalciferol (VITAMIN D3 PO) Take 1 tablet by mouth daily.     clopidogrel (PLAVIX) 75 MG tablet TAKE 1 TABLET EVERY DAY 90 tablet 3   omeprazole (PRILOSEC) 40 MG capsule Take 40 mg by mouth daily.     SANTYL ointment Apply topically daily.     No current facility-administered medications on file prior to visit.    There are no Patient Instructions on file for this visit. No follow-ups on file.   Kris Hartmann, NP

## 2021-06-11 ENCOUNTER — Other Ambulatory Visit: Payer: Self-pay | Admitting: Family Medicine

## 2021-06-11 ENCOUNTER — Telehealth: Payer: Self-pay | Admitting: Family Medicine

## 2021-06-11 DIAGNOSIS — N2889 Other specified disorders of kidney and ureter: Secondary | ICD-10-CM

## 2021-06-11 NOTE — Telephone Encounter (Signed)
Patient's wife notified appointment has been scheduled  on Fri 3/10 at 8:30a and arrive at 7:30a. IR will contact patient a few days ahead of time to go over instruction. Patient is aware to stop plavix 5 days prior.

## 2021-06-11 NOTE — Telephone Encounter (Signed)
Patient has been cleared by Dr. Delana Meyer to stop Plavix 5 days prior to renal biopsy. Order placed, waiting on date and time.

## 2021-06-15 NOTE — Progress Notes (Signed)
Patient on schedule for Renal biopsy 06/22/2021, called and spoke with patient and wife on phone with pre procedure instructions given. Holding Plavix/ASA with LD 06/16/2021, NPO after Mn prior to procedure as well as driver post procedure/recovery/discharge. Discussed prepare to be here for procedure and recovery at leat 4-6 hours. Stated understanding. ?

## 2021-06-21 ENCOUNTER — Other Ambulatory Visit: Payer: Self-pay | Admitting: Student

## 2021-06-22 ENCOUNTER — Encounter (INDEPENDENT_AMBULATORY_CARE_PROVIDER_SITE_OTHER): Payer: Medicare HMO

## 2021-06-22 ENCOUNTER — Ambulatory Visit
Admission: RE | Admit: 2021-06-22 | Discharge: 2021-06-22 | Disposition: A | Discharge status: Home / Self Care | Payer: Medicare HMO | Source: Ambulatory Visit | Attending: Urology | Admitting: Urology

## 2021-06-22 ENCOUNTER — Other Ambulatory Visit: Payer: Self-pay

## 2021-06-22 ENCOUNTER — Ambulatory Visit (INDEPENDENT_AMBULATORY_CARE_PROVIDER_SITE_OTHER): Payer: Medicare HMO | Admitting: Nurse Practitioner

## 2021-06-22 DIAGNOSIS — J439 Emphysema, unspecified: Secondary | ICD-10-CM | POA: Diagnosis not present

## 2021-06-22 DIAGNOSIS — F17210 Nicotine dependence, cigarettes, uncomplicated: Secondary | ICD-10-CM | POA: Insufficient documentation

## 2021-06-22 DIAGNOSIS — Z85820 Personal history of malignant melanoma of skin: Secondary | ICD-10-CM | POA: Insufficient documentation

## 2021-06-22 DIAGNOSIS — Z7982 Long term (current) use of aspirin: Secondary | ICD-10-CM | POA: Insufficient documentation

## 2021-06-22 DIAGNOSIS — Z7902 Long term (current) use of antithrombotics/antiplatelets: Secondary | ICD-10-CM | POA: Diagnosis not present

## 2021-06-22 DIAGNOSIS — N2889 Other specified disorders of kidney and ureter: Secondary | ICD-10-CM | POA: Diagnosis not present

## 2021-06-22 DIAGNOSIS — C641 Malignant neoplasm of right kidney, except renal pelvis: Secondary | ICD-10-CM | POA: Diagnosis not present

## 2021-06-22 DIAGNOSIS — I739 Peripheral vascular disease, unspecified: Secondary | ICD-10-CM | POA: Diagnosis not present

## 2021-06-22 LAB — CBC
HCT: 45 % (ref 39.0–52.0)
Hemoglobin: 15.2 g/dL (ref 13.0–17.0)
MCH: 31 pg (ref 26.0–34.0)
MCHC: 33.8 g/dL (ref 30.0–36.0)
MCV: 91.8 fL (ref 80.0–100.0)
Platelets: 304 10*3/uL (ref 150–400)
RBC: 4.9 MIL/uL (ref 4.22–5.81)
RDW: 12.8 % (ref 11.5–15.5)
WBC: 5.9 10*3/uL (ref 4.0–10.5)
nRBC: 0 % (ref 0.0–0.2)

## 2021-06-22 LAB — PROTIME-INR
INR: 1 (ref 0.8–1.2)
Prothrombin Time: 13.4 seconds (ref 11.4–15.2)

## 2021-06-22 MED ORDER — FENTANYL CITRATE (PF) 100 MCG/2ML IJ SOLN
INTRAMUSCULAR | Status: AC | PRN
  Administered 2021-06-22 (×2): 50 ug via INTRAVENOUS

## 2021-06-22 MED ORDER — MIDAZOLAM HCL 2 MG/2ML IJ SOLN
INTRAMUSCULAR | Status: AC | PRN
  Administered 2021-06-22 (×2): 1 mg via INTRAVENOUS

## 2021-06-22 MED ORDER — FENTANYL CITRATE (PF) 100 MCG/2ML IJ SOLN
INTRAMUSCULAR | Status: AC
  Filled 2021-06-22: qty 2

## 2021-06-22 MED ORDER — MIDAZOLAM HCL 2 MG/2ML IJ SOLN
INTRAMUSCULAR | Status: AC
  Filled 2021-06-22: qty 4

## 2021-06-22 MED ORDER — SODIUM CHLORIDE 0.9 % IV SOLN: INTRAVENOUS | Status: DC

## 2021-06-22 NOTE — Procedures (Signed)
Vascular and Interventional Radiology Procedure Note ? ?Patient: Derek Henderson ?DOB: [birth year] ?Medical Record Number: [id number] ?Note Date/Time: 06/22/21 9:54 AM  ? ?Performing Physician: Michaelle Birks, MD ?Assistant(s): None ? ?Diagnosis: R renal mass ? ?Procedure: RIGHT RENAL MASS BIOPSY ? ?Anesthesia: Conscious Sedation ?Complications: None ?Estimated Blood Loss: Minimal ?Specimens: Sent for Pathology ? ?Findings:  ?Successful Ultrasound-guided biopsy of R renal mass. ?A total of 2 samples were obtained. ?Hemostasis of the tract was achieved using Gelfoam Slurry Embolization. ? ?Plan: Bed rest for 2 hours. ? ?See detailed procedure note with images in PACS. ?The patient tolerated the procedure well without incident or complication and was returned to Recovery in stable condition.  ? ? ?Michaelle Birks, MD ?[facility] ?[facility] ? ? ?Pager. [phone] ?Clinic. [phone]  ?
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
## 2021-06-26 DIAGNOSIS — L814 Other melanin hyperpigmentation: Secondary | ICD-10-CM | POA: Diagnosis not present

## 2021-06-26 DIAGNOSIS — L821 Other seborrheic keratosis: Secondary | ICD-10-CM | POA: Diagnosis not present

## 2021-06-26 DIAGNOSIS — L570 Actinic keratosis: Secondary | ICD-10-CM | POA: Diagnosis not present

## 2021-06-26 DIAGNOSIS — D225 Melanocytic nevi of trunk: Secondary | ICD-10-CM | POA: Diagnosis not present

## 2021-06-26 DIAGNOSIS — Z85820 Personal history of malignant melanoma of skin: Secondary | ICD-10-CM | POA: Diagnosis not present

## 2021-07-05 LAB — SURGICAL PATHOLOGY

## 2021-07-09 ENCOUNTER — Telehealth: Payer: Self-pay | Admitting: Urology

## 2021-07-09 NOTE — Telephone Encounter (Signed)
Pt's wife called asking about U/S biopsy results done on 3/10. ?

## 2021-07-09 NOTE — Telephone Encounter (Signed)
Informed patient's wife Izora Gala, results were sent to IR versus Dr. Erlene Quan, offered to schedule follow up in office to discuss further. Declined, would prefer a phone call to figure out next steps. Explained will return call next week when Dr. Erlene Quan returns. Voiced understanding.  ?

## 2021-07-11 ENCOUNTER — Ambulatory Visit (INDEPENDENT_AMBULATORY_CARE_PROVIDER_SITE_OTHER): Payer: Medicare HMO | Admitting: Nurse Practitioner

## 2021-07-11 ENCOUNTER — Encounter (INDEPENDENT_AMBULATORY_CARE_PROVIDER_SITE_OTHER): Payer: Medicare HMO

## 2021-07-31 DIAGNOSIS — S92011D Displaced fracture of body of right calcaneus, subsequent encounter for fracture with routine healing: Secondary | ICD-10-CM | POA: Diagnosis not present

## 2021-07-31 DIAGNOSIS — L97512 Non-pressure chronic ulcer of other part of right foot with fat layer exposed: Secondary | ICD-10-CM | POA: Diagnosis not present

## 2021-10-24 DIAGNOSIS — D225 Melanocytic nevi of trunk: Secondary | ICD-10-CM | POA: Diagnosis not present

## 2021-10-24 DIAGNOSIS — L821 Other seborrheic keratosis: Secondary | ICD-10-CM | POA: Diagnosis not present

## 2021-10-24 DIAGNOSIS — Z85820 Personal history of malignant melanoma of skin: Secondary | ICD-10-CM | POA: Diagnosis not present

## 2021-10-24 DIAGNOSIS — L814 Other melanin hyperpigmentation: Secondary | ICD-10-CM | POA: Diagnosis not present

## 2021-10-24 DIAGNOSIS — C44629 Squamous cell carcinoma of skin of left upper limb, including shoulder: Secondary | ICD-10-CM | POA: Diagnosis not present

## 2021-11-26 DIAGNOSIS — C439 Malignant melanoma of skin, unspecified: Secondary | ICD-10-CM | POA: Diagnosis not present

## 2021-11-26 DIAGNOSIS — I739 Peripheral vascular disease, unspecified: Secondary | ICD-10-CM | POA: Diagnosis not present

## 2021-11-26 DIAGNOSIS — N2889 Other specified disorders of kidney and ureter: Secondary | ICD-10-CM | POA: Diagnosis not present

## 2021-11-27 ENCOUNTER — Ambulatory Visit
Admission: RE | Admit: 2021-11-27 | Discharge: 2021-11-27 | Disposition: A | Discharge status: Home / Self Care | Payer: Medicare HMO | Source: Ambulatory Visit | Attending: Urology | Admitting: Urology

## 2021-11-27 DIAGNOSIS — Z85118 Personal history of other malignant neoplasm of bronchus and lung: Secondary | ICD-10-CM | POA: Diagnosis not present

## 2021-11-27 DIAGNOSIS — I700 Atherosclerosis of aorta: Secondary | ICD-10-CM | POA: Diagnosis not present

## 2021-11-27 DIAGNOSIS — N2889 Other specified disorders of kidney and ureter: Secondary | ICD-10-CM | POA: Diagnosis not present

## 2021-11-27 MED ORDER — IOHEXOL 300 MG/ML  SOLN
100.0000 mL | Freq: Once | INTRAMUSCULAR | Status: AC | PRN
  Administered 2021-11-27: 100 mL via INTRAVENOUS

## 2021-11-28 ENCOUNTER — Other Ambulatory Visit (INDEPENDENT_AMBULATORY_CARE_PROVIDER_SITE_OTHER): Payer: Self-pay | Admitting: Nurse Practitioner

## 2021-11-28 DIAGNOSIS — Z9889 Other specified postprocedural states: Secondary | ICD-10-CM

## 2021-11-30 ENCOUNTER — Ambulatory Visit (INDEPENDENT_AMBULATORY_CARE_PROVIDER_SITE_OTHER): Payer: Medicare HMO | Admitting: Nurse Practitioner

## 2021-11-30 ENCOUNTER — Encounter (INDEPENDENT_AMBULATORY_CARE_PROVIDER_SITE_OTHER): Payer: Self-pay | Admitting: Nurse Practitioner

## 2021-11-30 ENCOUNTER — Ambulatory Visit (INDEPENDENT_AMBULATORY_CARE_PROVIDER_SITE_OTHER): Payer: Medicare HMO

## 2021-11-30 VITALS — BP 155/90 | HR 74 | Resp 16 | Wt 152.0 lb

## 2021-11-30 DIAGNOSIS — I10 Essential (primary) hypertension: Secondary | ICD-10-CM | POA: Diagnosis not present

## 2021-11-30 DIAGNOSIS — I739 Peripheral vascular disease, unspecified: Secondary | ICD-10-CM

## 2021-11-30 DIAGNOSIS — Z720 Tobacco use: Secondary | ICD-10-CM

## 2021-11-30 DIAGNOSIS — Z9889 Other specified postprocedural states: Secondary | ICD-10-CM

## 2021-11-30 MED ORDER — CLOPIDOGREL BISULFATE 75 MG PO TABS
75.0000 mg | ORAL_TABLET | Freq: Every day | ORAL | 3 refills | Status: DC
Start: 2021-11-30 — End: 2023-04-14

## 2021-12-03 DIAGNOSIS — J449 Chronic obstructive pulmonary disease, unspecified: Secondary | ICD-10-CM | POA: Diagnosis not present

## 2021-12-03 DIAGNOSIS — I739 Peripheral vascular disease, unspecified: Secondary | ICD-10-CM | POA: Diagnosis not present

## 2021-12-03 DIAGNOSIS — C641 Malignant neoplasm of right kidney, except renal pelvis: Secondary | ICD-10-CM | POA: Diagnosis not present

## 2021-12-03 DIAGNOSIS — Z79899 Other long term (current) drug therapy: Secondary | ICD-10-CM | POA: Diagnosis not present

## 2021-12-03 DIAGNOSIS — Z125 Encounter for screening for malignant neoplasm of prostate: Secondary | ICD-10-CM | POA: Diagnosis not present

## 2021-12-04 DIAGNOSIS — C44629 Squamous cell carcinoma of skin of left upper limb, including shoulder: Secondary | ICD-10-CM | POA: Diagnosis not present

## 2021-12-05 ENCOUNTER — Ambulatory Visit: Payer: Medicare HMO | Admitting: Urology

## 2021-12-05 ENCOUNTER — Encounter: Payer: Self-pay | Admitting: Urology

## 2021-12-05 VITALS — BP 167/78 | HR 60 | Ht 73.0 in | Wt 152.0 lb

## 2021-12-05 DIAGNOSIS — Z85528 Personal history of other malignant neoplasm of kidney: Secondary | ICD-10-CM | POA: Diagnosis not present

## 2021-12-05 DIAGNOSIS — N2889 Other specified disorders of kidney and ureter: Secondary | ICD-10-CM
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
## 2021-12-17 ENCOUNTER — Encounter (INDEPENDENT_AMBULATORY_CARE_PROVIDER_SITE_OTHER): Payer: Self-pay | Admitting: Nurse Practitioner
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
## 2021-12-20 ENCOUNTER — Encounter (INDEPENDENT_AMBULATORY_CARE_PROVIDER_SITE_OTHER): Payer: Medicare HMO

## 2021-12-20 ENCOUNTER — Ambulatory Visit (INDEPENDENT_AMBULATORY_CARE_PROVIDER_SITE_OTHER): Payer: Medicare HMO | Admitting: Vascular Surgery

## 2022-03-04 IMAGING — CT CT ABDOMEN WO/W CM
3 of 13 series · 10 of 46 positions shown, 16 images · IV contrast (agent unspecified)
Comparison: CT chest abdomen pelvis, 01/09/2021

CLINICAL DATA: Follow-up renal mass incidentally identified by
prior CT

EXAM:
CT ABDOMEN WITHOUT AND WITH CONTRAST
TECHNIQUE: Multidetector CT imaging of the abdomen was performed following the
standard protocol before and following the bolus administration of
intravenous contrast.

[Series 2: axial without renal without 2.00 · axial · non-contrast · 0.63mm/px · z∈[-1423,-1247]mm · 5 of 133 slices shown, 10 images]
[im 23/133  soft-tissue]
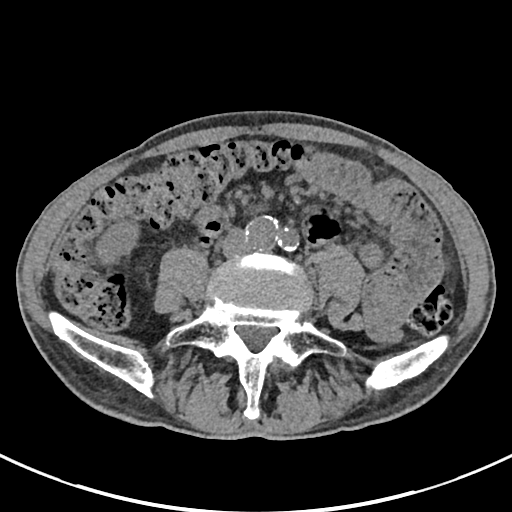
[im 23/133  bone]
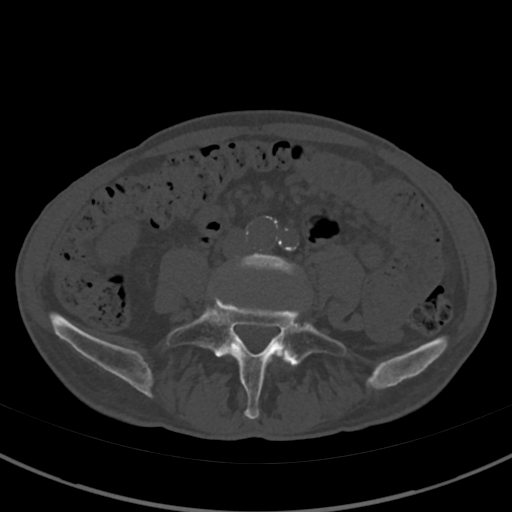
[im 45/133  soft-tissue]
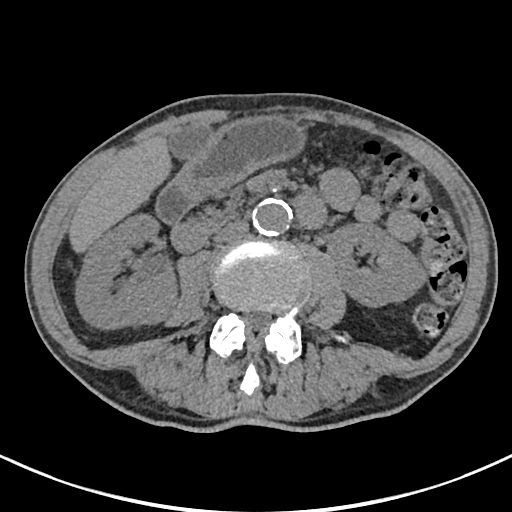
[im 45/133  lung]
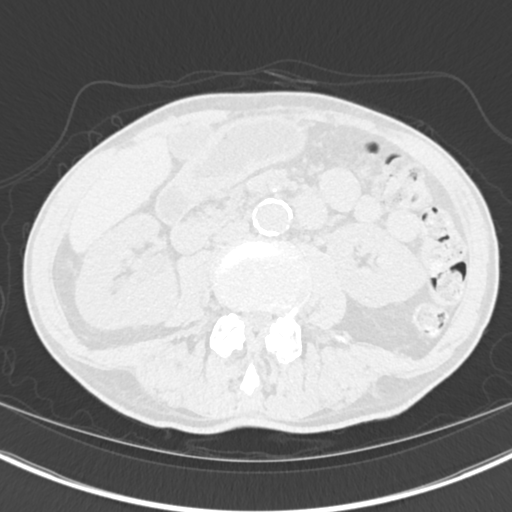
[im 67/133  soft-tissue]
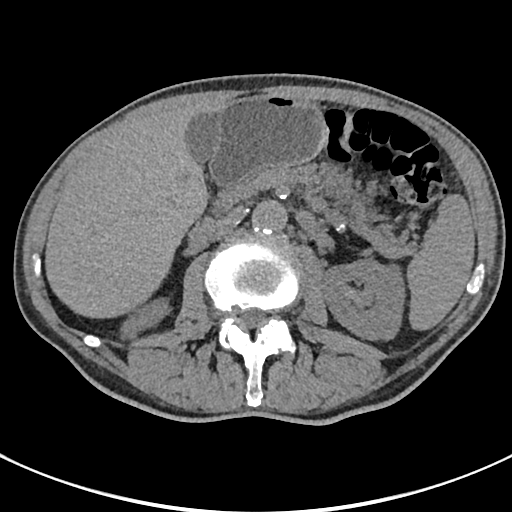
[im 67/133  lung]
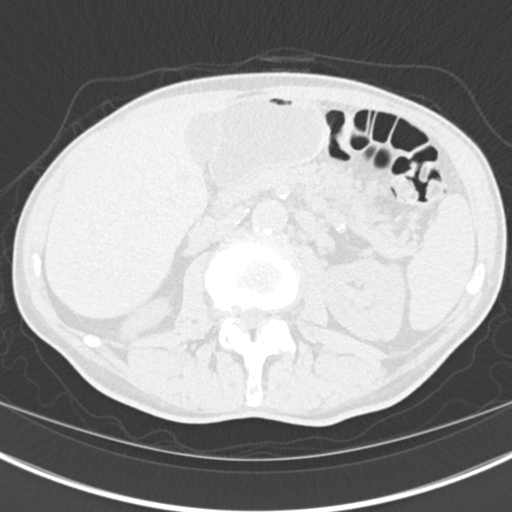
[im 89/133  soft-tissue]
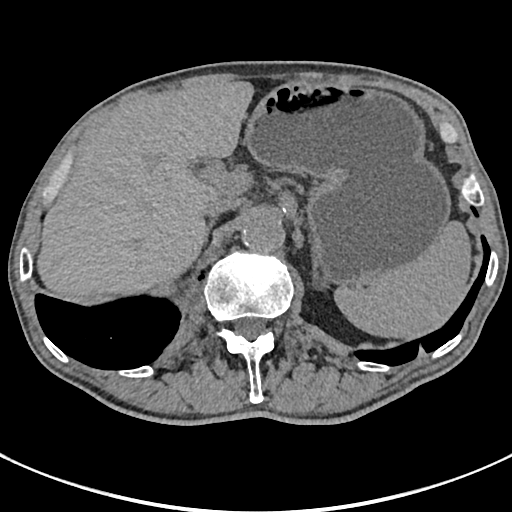
[im 89/133  lung]
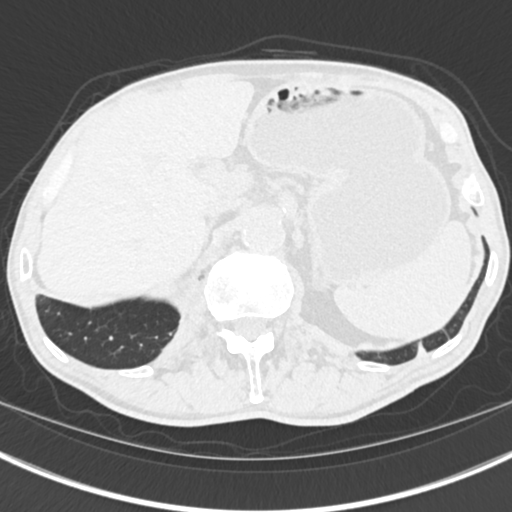
[im 111/133  soft-tissue]
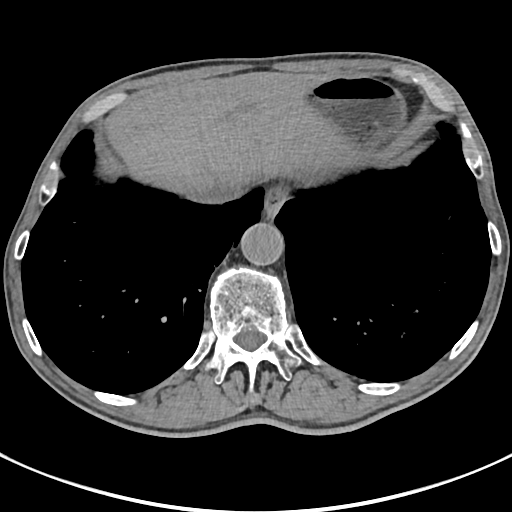
[im 111/133  lung]
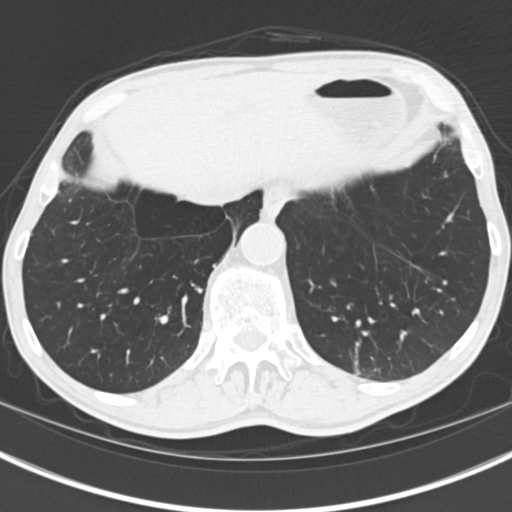

[Series 4: coronal without renal without 2.00 cor · coronal · non-contrast · 0.52mm/px · 2 of 129 slices shown, 3 images]
[im 43/129  soft-tissue]
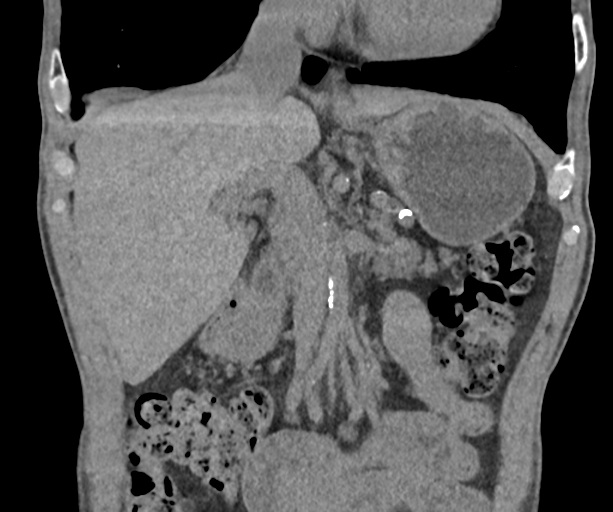
[im 43/129  bone]
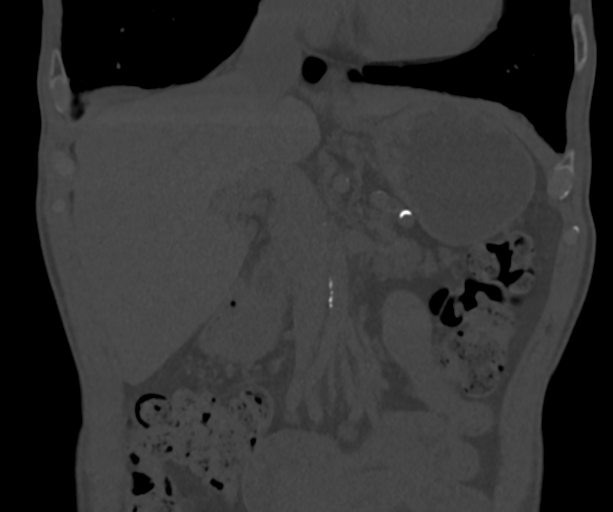
[im 86/129  soft-tissue]
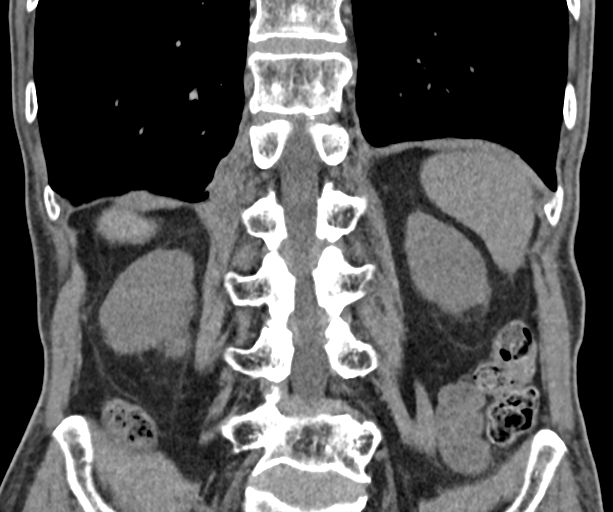

[Series 8: axial arterial renal arterial 2.00 ax · axial · arterial · 0.53mm/px · z∈[-1415,-1309]mm · 3 of 133 slices shown]
[im 27/133  soft-tissue]
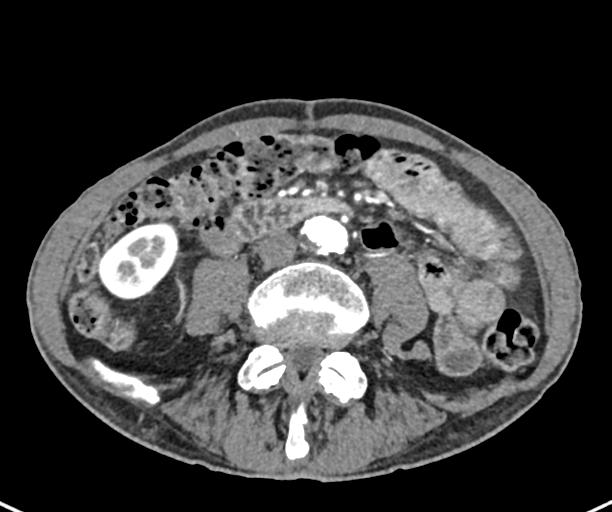
[im 53/133  soft-tissue]
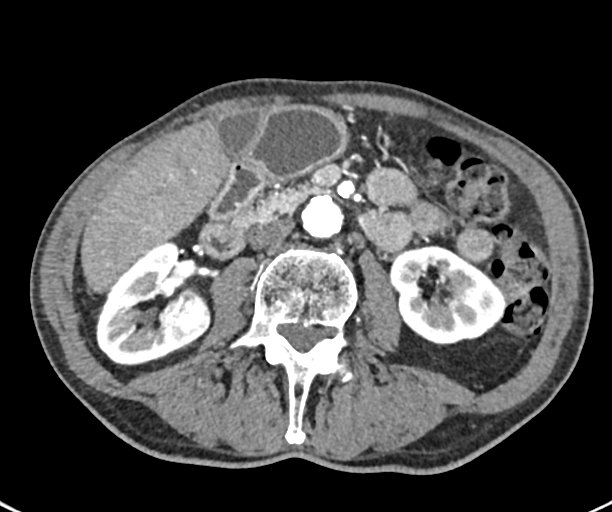
[im 80/133  soft-tissue]
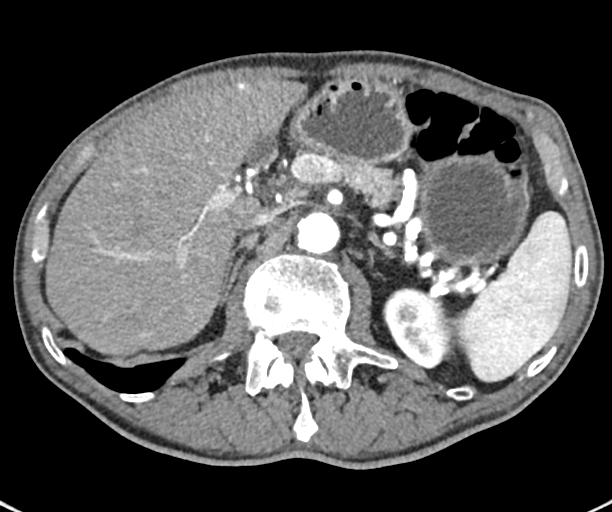

[10 of 46 positions shown; findings below may reference images not displayed]

RADIATION DOSE REDUCTION: This exam was performed according to the
departmental dose-optimization program which includes automated
exposure control, adjustment of the mA and/or kV according to
patient size and/or use of iterative reconstruction technique.

CONTRAST:  100mL OMNIPAQUE IOHEXOL 300 MG/ML  SOLN
FINDINGS: Lower chest: No acute abnormality.  Severe emphysema.

Hepatobiliary: Focal, arterially hyperenhancing subcapsular lesion
of anterior hepatic segment IV B, measuring no greater than 0.3 cm
(series 8, image 55). No gallstones, gallbladder wall thickening, or
biliary dilatation.

Pancreas: Unremarkable. No pancreatic ductal dilatation or
surrounding inflammatory changes.

Spleen: Normal in size without significant abnormality.

Adrenals/Urinary Tract: Adrenal glands are unremarkable.
Redemonstrated, heterogeneously enhancing mass of the posterior lip
of the midportion of the right kidney, measuring 4.7 x 3.6 cm,
perhaps minimally enlarged, previously 4.5 x 3.3 cm (series 8, image
91). No evidence of renal vein invasion. The left kidney is normal,
without renal calculi, solid lesion, or hydronephrosis.

Stomach/Bowel: Stomach is within normal limits. No evidence of bowel
wall thickening, distention, or inflammatory changes. Large burden
of stool throughout the colon.

Vascular/Lymphatic: Aortic atherosclerosis. Ectasia of the
infrarenal abdominal aorta, measuring up to 2.6 x 2.6 cm. Aneurysmal
dilatation of the origin of the right common iliac artery, measuring
up to 2.1 x 2.1 cm (series 8, image 114). Unchanged prominent
subcentimeter left retroperitoneal nodes (series 8, image 71). No
pathologically enlarged abdominal lymph nodes.

Other: No abdominal wall hernia or abnormality. No ascites.

Musculoskeletal: No acute or significant osseous findings.
IMPRESSION: 1. Redemonstrated, heterogeneously enhancing mass of the posterior
lip of the midportion of the right kidney, measuring 4.7 x 3.6 cm,
perhaps minimally enlarged, previously 4.5 x 3.3 cm. Findings remain
consistent with renal cell carcinoma.
2. No evidence of renal vein invasion.
3. Unchanged prominent subcentimeter left retroperitoneal nodes,
nonspecific. No pathologically enlarged abdominal lymph nodes.
Attention on follow-up.
4. Focal, arterially hyperenhancing subcapsular lesion of anterior
hepatic segment IV B, measuring no greater than 0.3 cm, likely a
small flash filling hemangioma or perfusion variant, isolated
manifestation of metastatic disease strongly disfavored. Attention
on follow-up.
5. Aneurysmal dilatation of the origin of the right common iliac
artery, measuring up to 2.1 x 2.1 cm.
6. Severe emphysema.

Aortic Atherosclerosis (3ZMJN-M6M.M) and Emphysema (3ZMJN-BC6.5).

## 2022-05-01 DIAGNOSIS — L570 Actinic keratosis: Secondary | ICD-10-CM | POA: Diagnosis not present

## 2022-05-01 DIAGNOSIS — L821 Other seborrheic keratosis: Secondary | ICD-10-CM | POA: Diagnosis not present

## 2022-05-09 DIAGNOSIS — H6123 Impacted cerumen, bilateral: Secondary | ICD-10-CM | POA: Diagnosis not present

## 2022-06-04 DIAGNOSIS — Z79899 Other long term (current) drug therapy: Secondary | ICD-10-CM | POA: Diagnosis not present

## 2022-06-04 DIAGNOSIS — Z125 Encounter for screening for malignant neoplasm of prostate: Secondary | ICD-10-CM | POA: Diagnosis not present

## 2022-06-11 DIAGNOSIS — F17200 Nicotine dependence, unspecified, uncomplicated: Secondary | ICD-10-CM | POA: Diagnosis not present

## 2022-06-11 DIAGNOSIS — R0789 Other chest pain: Secondary | ICD-10-CM | POA: Diagnosis not present

## 2022-06-11 DIAGNOSIS — I739 Peripheral vascular disease, unspecified: Secondary | ICD-10-CM | POA: Diagnosis not present

## 2022-06-11 DIAGNOSIS — J449 Chronic obstructive pulmonary disease, unspecified: Secondary | ICD-10-CM | POA: Diagnosis not present

## 2022-06-11 DIAGNOSIS — Z1331 Encounter for screening for depression: Secondary | ICD-10-CM | POA: Diagnosis not present

## 2022-06-11 DIAGNOSIS — Z0000 Encounter for general adult medical examination without abnormal findings: Secondary | ICD-10-CM | POA: Diagnosis not present

## 2022-07-16 DIAGNOSIS — Z23 Encounter for immunization: Secondary | ICD-10-CM | POA: Diagnosis not present

## 2022-07-16 DIAGNOSIS — W548XXA Other contact with dog, initial encounter: Secondary | ICD-10-CM | POA: Diagnosis not present

## 2022-07-16 DIAGNOSIS — M7989 Other specified soft tissue disorders: Secondary | ICD-10-CM | POA: Diagnosis not present

## 2022-08-15 ENCOUNTER — Emergency Department: Payer: Medicare HMO

## 2022-08-15 ENCOUNTER — Inpatient Hospital Stay
Admission: EM | Admit: 2022-08-15 | Discharge: 2022-08-20 | DRG: 871 | Disposition: A | Discharge status: Home / Self Care | Payer: Medicare HMO | Attending: Internal Medicine | Admitting: Internal Medicine

## 2022-08-15 ENCOUNTER — Encounter: Payer: Self-pay | Admitting: Emergency Medicine

## 2022-08-15 DIAGNOSIS — R6520 Severe sepsis without septic shock: Secondary | ICD-10-CM | POA: Diagnosis present

## 2022-08-15 DIAGNOSIS — A419 Sepsis, unspecified organism: Principal | ICD-10-CM | POA: Diagnosis present

## 2022-08-15 DIAGNOSIS — I70213 Atherosclerosis of native arteries of extremities with intermittent claudication, bilateral legs: Secondary | ICD-10-CM | POA: Diagnosis present

## 2022-08-15 DIAGNOSIS — R197 Diarrhea, unspecified: Secondary | ICD-10-CM | POA: Insufficient documentation

## 2022-08-15 DIAGNOSIS — G4733 Obstructive sleep apnea (adult) (pediatric): Secondary | ICD-10-CM | POA: Diagnosis present

## 2022-08-15 DIAGNOSIS — J440 Chronic obstructive pulmonary disease with acute lower respiratory infection: Secondary | ICD-10-CM | POA: Diagnosis present

## 2022-08-15 DIAGNOSIS — J9601 Acute respiratory failure with hypoxia: Secondary | ICD-10-CM | POA: Diagnosis not present

## 2022-08-15 DIAGNOSIS — R112 Nausea with vomiting, unspecified: Secondary | ICD-10-CM | POA: Diagnosis present

## 2022-08-15 DIAGNOSIS — E785 Hyperlipidemia, unspecified: Secondary | ICD-10-CM | POA: Diagnosis present

## 2022-08-15 DIAGNOSIS — J431 Panlobular emphysema: Secondary | ICD-10-CM | POA: Diagnosis present

## 2022-08-15 DIAGNOSIS — Z85820 Personal history of malignant melanoma of skin: Secondary | ICD-10-CM | POA: Diagnosis not present

## 2022-08-15 DIAGNOSIS — R918 Other nonspecific abnormal finding of lung field: Secondary | ICD-10-CM

## 2022-08-15 DIAGNOSIS — E8721 Acute metabolic acidosis: Secondary | ICD-10-CM | POA: Diagnosis not present

## 2022-08-15 DIAGNOSIS — H9190 Unspecified hearing loss, unspecified ear: Secondary | ICD-10-CM | POA: Diagnosis present

## 2022-08-15 DIAGNOSIS — N179 Acute kidney failure, unspecified: Secondary | ICD-10-CM | POA: Diagnosis present

## 2022-08-15 DIAGNOSIS — Z79899 Other long term (current) drug therapy: Secondary | ICD-10-CM

## 2022-08-15 DIAGNOSIS — Z885 Allergy status to narcotic agent status: Secondary | ICD-10-CM | POA: Diagnosis not present

## 2022-08-15 DIAGNOSIS — Z7982 Long term (current) use of aspirin: Secondary | ICD-10-CM | POA: Diagnosis not present

## 2022-08-15 DIAGNOSIS — J168 Pneumonia due to other specified infectious organisms: Secondary | ICD-10-CM | POA: Diagnosis not present

## 2022-08-15 DIAGNOSIS — R0602 Shortness of breath: Secondary | ICD-10-CM | POA: Diagnosis not present

## 2022-08-15 DIAGNOSIS — Z7902 Long term (current) use of antithrombotics/antiplatelets: Secondary | ICD-10-CM

## 2022-08-15 DIAGNOSIS — C642 Malignant neoplasm of left kidney, except renal pelvis: Secondary | ICD-10-CM | POA: Diagnosis not present

## 2022-08-15 DIAGNOSIS — E869 Volume depletion, unspecified: Secondary | ICD-10-CM | POA: Diagnosis not present

## 2022-08-15 DIAGNOSIS — I739 Peripheral vascular disease, unspecified: Secondary | ICD-10-CM | POA: Diagnosis present

## 2022-08-15 DIAGNOSIS — J189 Pneumonia, unspecified organism: Secondary | ICD-10-CM | POA: Diagnosis present

## 2022-08-15 DIAGNOSIS — J432 Centrilobular emphysema: Secondary | ICD-10-CM | POA: Diagnosis not present

## 2022-08-15 DIAGNOSIS — I70219 Atherosclerosis of native arteries of extremities with intermittent claudication, unspecified extremity: Secondary | ICD-10-CM | POA: Diagnosis present

## 2022-08-15 DIAGNOSIS — Z85528 Personal history of other malignant neoplasm of kidney: Secondary | ICD-10-CM

## 2022-08-15 DIAGNOSIS — Z720 Tobacco use: Secondary | ICD-10-CM | POA: Diagnosis not present

## 2022-08-15 DIAGNOSIS — R000 Tachycardia, unspecified: Secondary | ICD-10-CM | POA: Diagnosis not present

## 2022-08-15 DIAGNOSIS — I10 Essential (primary) hypertension: Secondary | ICD-10-CM | POA: Diagnosis present

## 2022-08-15 LAB — LIPASE, BLOOD: Lipase: 21 U/L (ref 11–51)

## 2022-08-15 LAB — URINALYSIS, ROUTINE W REFLEX MICROSCOPIC
Glucose, UA: 50 mg/dL — AB
Ketones, ur: NEGATIVE mg/dL
Leukocytes,Ua: NEGATIVE
Nitrite: NEGATIVE
Protein, ur: 100 mg/dL — AB
Specific Gravity, Urine: 1.03 (ref 1.005–1.030)
pH: 5 (ref 5.0–8.0)

## 2022-08-15 LAB — COMPREHENSIVE METABOLIC PANEL
ALT: 14 U/L (ref 0–44)
AST: 28 U/L (ref 15–41)
Albumin: 3.8 g/dL (ref 3.5–5.0)
Alkaline Phosphatase: 61 U/L (ref 38–126)
Anion gap: 16 — ABNORMAL HIGH (ref 5–15)
BUN: 32 mg/dL — ABNORMAL HIGH (ref 8–23)
CO2: 21 mmol/L — ABNORMAL LOW (ref 22–32)
Calcium: 9.9 mg/dL (ref 8.9–10.3)
Chloride: 97 mmol/L — ABNORMAL LOW (ref 98–111)
Creatinine, Ser: 2.24 mg/dL — ABNORMAL HIGH (ref 0.61–1.24)
GFR, Estimated: 30 mL/min — ABNORMAL LOW (ref >60)
Glucose, Bld: 102 mg/dL — ABNORMAL HIGH (ref 70–99)
Potassium: 5.1 mmol/L (ref 3.5–5.1)
Sodium: 134 mmol/L — ABNORMAL LOW (ref 135–145)
Total Bilirubin: 1.7 mg/dL — ABNORMAL HIGH (ref 0.3–1.2)
Total Protein: 7.2 g/dL (ref 6.5–8.1)

## 2022-08-15 LAB — CBC
HCT: 54.9 % — ABNORMAL HIGH (ref 39.0–52.0)
Hemoglobin: 18.6 g/dL — ABNORMAL HIGH (ref 13.0–17.0)
MCH: 31.7 pg (ref 26.0–34.0)
MCHC: 33.9 g/dL (ref 30.0–36.0)
MCV: 93.5 fL (ref 80.0–100.0)
Platelets: 325 10*3/uL (ref 150–400)
RBC: 5.87 MIL/uL — ABNORMAL HIGH (ref 4.22–5.81)
RDW: 13.1 % (ref 11.5–15.5)
WBC: 22.6 10*3/uL — ABNORMAL HIGH (ref 4.0–10.5)
nRBC: 0 % (ref 0.0–0.2)

## 2022-08-15 LAB — PROCALCITONIN: Procalcitonin: 40.79 ng/mL

## 2022-08-15 LAB — TROPONIN I (HIGH SENSITIVITY)
Troponin I (High Sensitivity): 25 ng/L — ABNORMAL HIGH (ref <18)
Troponin I (High Sensitivity): 22 ng/L — ABNORMAL HIGH (ref <18)

## 2022-08-15 LAB — LACTIC ACID, PLASMA
Lactic Acid, Venous: 4.5 mmol/L (ref 0.5–1.9)
Lactic Acid, Venous: 3.8 mmol/L (ref 0.5–1.9)

## 2022-08-15 MED ORDER — SODIUM CHLORIDE 0.9 % IV BOLUS (SEPSIS)
1000.0000 mL | Freq: Once | INTRAVENOUS | Status: AC
  Administered 2022-08-15: 1000 mL via INTRAVENOUS

## 2022-08-15 MED ORDER — ACETAMINOPHEN 650 MG RE SUPP: 650.0000 mg | Freq: Four times a day (QID) | RECTAL | Status: AC | PRN

## 2022-08-15 MED ORDER — SODIUM CHLORIDE 0.9 % IV SOLN: 2.0000 g | INTRAVENOUS | Status: DC

## 2022-08-15 MED ORDER — ONDANSETRON HCL 4 MG PO TABS: 4.0000 mg | ORAL_TABLET | Freq: Four times a day (QID) | ORAL | Status: AC | PRN

## 2022-08-15 MED ORDER — SODIUM CHLORIDE 0.9 % IV SOLN
2.0000 g | INTRAVENOUS | Status: AC
  Administered 2022-08-15 – 2022-08-19 (×5): 2 g via INTRAVENOUS
  Filled 2022-08-15 (×5): qty 20

## 2022-08-15 MED ORDER — SODIUM CHLORIDE 0.9 % IV SOLN
500.0000 mg | INTRAVENOUS | Status: DC
  Administered 2022-08-15 – 2022-08-16 (×2): 500 mg via INTRAVENOUS
  Filled 2022-08-15 (×2): qty 5

## 2022-08-15 MED ORDER — SENNOSIDES-DOCUSATE SODIUM 8.6-50 MG PO TABS: 1.0000 | ORAL_TABLET | Freq: Every evening | ORAL | Status: DC | PRN

## 2022-08-15 MED ORDER — PANTOPRAZOLE SODIUM 40 MG PO TBEC
80.0000 mg | DELAYED_RELEASE_TABLET | Freq: Every day | ORAL | Status: DC
  Administered 2022-08-16 – 2022-08-20 (×5): 80 mg via ORAL
  Filled 2022-08-15 (×5): qty 2

## 2022-08-15 MED ORDER — LACTATED RINGERS IV SOLN: INTRAVENOUS | Status: DC

## 2022-08-15 MED ORDER — LACTATED RINGERS IV SOLN
150.0000 mL/h | INTRAVENOUS | Status: DC
  Administered 2022-08-15 (×2): 150 mL/h via INTRAVENOUS

## 2022-08-15 MED ORDER — SODIUM CHLORIDE 0.9 % IV SOLN: Freq: Once | INTRAVENOUS | Status: AC

## 2022-08-15 MED ORDER — ACETAMINOPHEN 325 MG PO TABS: 650.0000 mg | ORAL_TABLET | Freq: Four times a day (QID) | ORAL | Status: AC | PRN

## 2022-08-15 MED ORDER — SODIUM CHLORIDE 0.9 % IV BOLUS
250.0000 mL | Freq: Once | INTRAVENOUS | Status: AC
  Administered 2022-08-15: 250 mL via INTRAVENOUS

## 2022-08-15 MED ORDER — IPRATROPIUM-ALBUTEROL 0.5-2.5 (3) MG/3ML IN SOLN: 3.0000 mL | Freq: Four times a day (QID) | RESPIRATORY_TRACT | Status: AC | PRN

## 2022-08-15 MED ORDER — HEPARIN SODIUM (PORCINE) 5000 UNIT/ML IJ SOLN
5000.0000 [IU] | Freq: Three times a day (TID) | INTRAMUSCULAR | Status: DC
  Administered 2022-08-15 – 2022-08-20 (×14): 5000 [IU] via SUBCUTANEOUS
  Filled 2022-08-15 (×14): qty 1

## 2022-08-15 MED ORDER — NICOTINE 14 MG/24HR TD PT24: 14.0000 mg | MEDICATED_PATCH | Freq: Every day | TRANSDERMAL | Status: DC | PRN

## 2022-08-15 MED ORDER — SODIUM CHLORIDE 0.9 % IV SOLN: 500.0000 mg | INTRAVENOUS | Status: DC

## 2022-08-15 MED ORDER — HYDRALAZINE HCL 20 MG/ML IJ SOLN: 5.0000 mg | Freq: Three times a day (TID) | INTRAMUSCULAR | Status: AC | PRN

## 2022-08-15 MED ORDER — IOHEXOL 300 MG/ML  SOLN
75.0000 mL | Freq: Once | INTRAMUSCULAR | Status: AC | PRN
  Administered 2022-08-15: 75 mL via INTRAVENOUS

## 2022-08-15 MED ORDER — ONDANSETRON HCL 4 MG/2ML IJ SOLN: 4.0000 mg | Freq: Four times a day (QID) | INTRAMUSCULAR | Status: AC | PRN

## 2022-08-15 NOTE — Assessment & Plan Note (Signed)
-   As needed nicotine patch ordered for nicotine craving 

## 2022-08-15 NOTE — Sepsis Progress Note (Signed)
Sepsis protocol monitored by [employer] ?

## 2022-08-15 NOTE — H&P (Signed)
History and Physical   Derek Henderson WRU:[id number] DOB: 1946/01/26 DOA: 08/15/2022  PCP: Danella Penton, MD  Patient coming from: Outpatient clinic office via POV  I have personally briefly reviewed patient's old medical records in [facility] EMR.  Chief Concern: Shortness of breath  HPI: Derek Henderson is a 77 year old male with history of COPD, hyperlipidemia, GERD, clear-cell carcinoma of the right kidney, PAD, malignant melanoma, pan lobar emphysema, who presents emergency department for chief concerns of shortness of breath, nausea, vomiting, diarrhea.  Vitals in the ED showed temperature of 97.9, respiration rate of 18, heart rate of 106, blood pressure 120/61, SpO2 94% on room air.  Serum sodium is 134, potassium 5.1, chloride 97, bicarb 21, BUN of 32, serum creatinine of 2.24, EGFR 30, nonfasting blood glucose 102, WBC 22.6, hemoglobin 18.6, platelets of 325.  High sensitive troponin is 25.  Lactic acid is initially 4.5 and on repeat was 3.8.  UA was negative for leukocytes and nitrates.  Blood cultures are collected and are in process.  ED treatment: Sodium chloride 1 L bolus x 1, 250 mL bolus ordered and pending administration, ceftriaxone 2 g IV daily, azithromycin 500 mg IV, daily for 5-day course were ordered by EDP --------------------------- At bedside, patient was able to tell me his name, age, location, current calendar year.  Reports he started having worsening shortness of breath last night.  He states he has been having chronic shortness of breath for 6 years.  He denies known fever, new cough, chest pain, shortness of breath, abdominal pain, dysuria, hematuria, nausea, vomiting.  He denies diarrhea.  He denies weakness or change to appetite.  He reports he does not wear oxygen at baseline.  Social history: He lives at home with his wife.  He endorses half a pack of smoking cigarettes per day.  He is not ready to quit.  He denies EtOH and recreational drug  use.  ROS: Constitutional: no weight change, no fever ENT/Mouth: no sore throat, no rhinorrhea Eyes: no eye pain, no vision changes Cardiovascular: no chest pain,+ dyspnea,  no edema, no palpitations Respiratory: no cough, no sputum, no wheezing Gastrointestinal: no nausea, no vomiting, no diarrhea, no constipation Genitourinary: no urinary incontinence, no dysuria, no hematuria Musculoskeletal: no arthralgias, no myalgias Skin: no skin lesions, no pruritus, Neuro: no weakness, no loss of consciousness, no syncope Psych: no anxiety, no depression, no decrease appetite Heme/Lymph: no bruising, no bleeding  ED Course: Discussed with emergency medicine provider, patient requiring hospitalization for chief concerns of severe sepsis.  Assessment/Plan  Principal Problem:   Severe sepsis (HCC) Active Problems:   History of malignant melanoma   Panlobular emphysema (HCC)   Tobacco abuse   Peripheral artery disease (HCC)   Atherosclerosis of native arteries of extremity with intermittent claudication (HCC)   Benign essential hypertension   AKI (acute kidney injury) (HCC)   Diarrhea   Assessment and Plan:  * Severe sepsis (HCC) Status post sodium chloride 1 L bolus, sodium chloride 250 mL bolus per EDP Ordered additional sodium chloride 1 L bolus on admission Continue with LR 150 mL/h, 20 hours ordered Blood cultures x 2 are in progress Continue with ceftriaxone and azithromycin DuoNebs every 6 hours as needed for wheezing and shortness of breath, 18 hours ordered Check procalcitonin stat on admission and repeat procalcitonin in the a.m. to ensure appropriate antibiotic therapy Admit to PCU, inpatient  Diarrhea With markedly elevated leukocytosis Check C. difficile screening and GI panel screening on admission  No antidiarrheal medication will be given until the above have resulted as negative  AKI (acute kidney injury) (HCC) Presumed secondary to severe sepsis, no CKD at  baseline Treat per sepsis Repeat BMP in the a.m.  Benign essential hypertension Hydralazine 5 mg IV every 8 hours as needed for SBP greater 180, 4 days ordered  Tobacco abuse As needed nicotine patch ordered for nicotine craving  Chart reviewed.   DVT prophylaxis: Heparin 5000 units subcutaneous every 8 hours Code Status: Full code Diet: Heart healthy Family Communication: A phone call was offered, patient declined, stating that his wife Harriett Sine already knows he is in the hospital Disposition Plan: Pending clinical course Consults called: None at this time Admission status: PCU, inpatient  Past Medical History:  Diagnosis Date   Cancer (HCC)    melanoma   COPD (chronic obstructive pulmonary disease) (HCC)    Emphysema lung (HCC)    History of malignant melanoma    Mild obstructive sleep apnea    can not afford CPAP machine   PAD (peripheral artery disease) (HCC)    Panlobular emphysema (HCC)    Tobacco abuse    Past Surgical History:  Procedure Laterality Date   HERNIA REPAIR Left    LOWER EXTREMITY ANGIOGRAPHY Left 02/11/2017   Procedure: Lower Extremity Angiography;  Surgeon: Renford Dills, MD;  Location: [facility] INVASIVE CV LAB;  Service: Cardiovascular;  Laterality: Left;   LOWER EXTREMITY ANGIOGRAPHY Right 02/26/2021   Procedure: LOWER EXTREMITY ANGIOGRAPHY;  Surgeon: Annice Needy, MD;  Location: [facility] INVASIVE CV LAB;  Service: Cardiovascular;  Laterality: Right;   LOWER EXTREMITY INTERVENTION  02/11/2017   Procedure: LOWER EXTREMITY INTERVENTION;  Surgeon: Renford Dills, MD;  Location: [facility] INVASIVE CV LAB;  Service: Cardiovascular;;   MELANOMA EXCISION Right 2010   Social History:  reports that he quit smoking about 19 months ago. His smoking use included cigarettes. He has a 45.00 pack-year smoking history. His smokeless tobacco use includes chew. He reports current alcohol use. He reports that he does not use drugs.  Allergies  Allergen Reactions    Codeine Other (See Comments)    Headache    Family History  Problem Relation Age of Onset   Cancer Mother        lung and breast   Tuberculosis Mother    Cancer Father        throat   Cancer Sister        stomach   Cancer Brother        Brain    Cancer Sister        lung   Cancer Brother        lung   Family history: Family history reviewed and not pertinent.  Prior to Admission medications   Medication Sig Start Date End Date Taking? Authorizing Provider  acetaminophen (TYLENOL) 325 MG tablet Take 2 tablets (650 mg total) by mouth every 6 (six) hours as needed for mild pain. 01/16/21   Meuth, Brooke A, PA-C  albuterol (VENTOLIN HFA) 108 (90 Base) MCG/ACT inhaler Inhale 1-2 puffs into the lungs every 6 (six) hours as needed for shortness of breath (use when you have trouble breathing.). 12/10/18   Shane Crutch, MD  aspirin EC 81 MG tablet Take 1 tablet (81 mg total) by mouth daily. 02/12/17   Schnier, Latina Craver, MD  atorvastatin (LIPITOR) 10 MG tablet Take 1 tablet (10 mg total) by mouth daily. 02/26/21 02/26/22  Annice Needy, MD  Cholecalciferol (VITAMIN D3 PO)  Take 1 tablet by mouth daily.    [provider]  clopidogrel (PLAVIX) 75 MG tablet Take 1 tablet (75 mg total) by mouth daily. 11/30/21   Georgiana Spinner, NP  omeprazole (PRILOSEC) 40 MG capsule Take 40 mg by mouth daily. 01/16/21   [provider]  SANTYL ointment Apply topically daily. 02/15/21   [provider]   Physical Exam: Vitals:   08/15/22 1241 08/15/22 1500 08/15/22 1524  BP: 120/61 119/61   Pulse: (!) 106 81   Resp: 18 (!) 21   Temp: 97.9 F (36.6 C) 98 F (36.7 C)   TempSrc: Oral Oral   SpO2: 94% 98% 97%   Constitutional: appears older than chronological age, frail, cachectic appearing, NAD, calm Eyes: PERRL, lids and conjunctivae normal ENMT: Mucous membranes are moist. Posterior pharynx clear of any exudate or lesions. Age-appropriate dentition.  Bilateral severe  hearing loss Neck: normal, supple, no masses, no thyromegaly Respiratory: Generalized decreased lung sounds bilaterally, worse in the left middle and lower lobe. Normal respiratory effort. No accessory muscle use.  Nasal cannula in place Cardiovascular: Regular rate and rhythm, no murmurs / rubs / gallops. No extremity edema. 2+ pedal pulses. No carotid bruits.  Abdomen: Scaphoid abdomen, no tenderness, no masses palpated, no hepatosplenomegaly. Bowel sounds positive.  Musculoskeletal: no clubbing / cyanosis. No joint deformity upper and lower extremities. Good ROM, no contractures, no atrophy. Normal muscle tone.  Skin: no rashes, lesions, ulcers. No induration Neurologic: Sensation intact. Strength 5/5 in all 4.  Psychiatric: Normal judgment and insight. Alert and oriented x 3. Normal mood.   EKG: independently reviewed, showing sinus tachycardia with rate of 109, QTc 517  Chest x-ray on Admission: I personally reviewed and I agree with radiologist reading as below.  CT CHEST ABDOMEN PELVIS W CONTRAST  Result Date: 08/15/2022 CLINICAL DATA:  Sepsis.  History of COPD.  New onset dyspnea EXAM: CT CHEST, ABDOMEN, AND PELVIS WITH CONTRAST TECHNIQUE: Multidetector CT imaging of the chest, abdomen and pelvis was performed following the standard protocol during bolus administration of intravenous contrast. RADIATION DOSE REDUCTION: This exam was performed according to the departmental dose-optimization program which includes automated exposure control, adjustment of the mA and/or kV according to patient size and/or use of iterative reconstruction technique. CONTRAST:  75mL OMNIPAQUE IOHEXOL 300 MG/ML  SOLN COMPARISON:  Prior chest CT scan 12/04/2018. Abdomen pelvis CT scan 01/09/2021. Other comparison exams. FINDINGS: CT CHEST FINDINGS Cardiovascular: Heart is nonenlarged. Trace pericardial fluid. Coronary artery calcifications are seen. The thoracic aorta has a normal course and caliber with scattered  mild atherosclerotic calcified plaque. Mediastinum/Nodes: Normal caliber thoracic esophagus. Preserved thyroid gland. No specific abnormal lymph node enlargement present in the axillary region, right hilum. Right hilar node on series 2, image 35 measures 18 by 13 mm. There are some prominent mediastinal nodes. Example precarinal on series 2 image 30 measures 20 by 15 mm. This has increased in size from previous. There also is an enlarging subcarinal node measuring 3.6 by 1.3 cm on series 2, image 37. Lungs/Pleura: Advanced centrilobular emphysematous lung changes are identified, greatest in the upper lung zones. Consolidative opacity seen along the left lower lobe with some interstitial components. Acute infiltrate is possible such as pneumonia. No significant effusion. No additional areas of consolidation. No pneumothorax. Musculoskeletal: Chronic left upper chest rib deformities. Healed fractures. CT ABDOMEN PELVIS FINDINGS Hepatobiliary: No focal liver abnormality is seen. No gallstones, gallbladder wall thickening, or biliary dilatation. Pancreas: Global pancreatic atrophy, unchanged from previous.  Spleen: Normal in size without focal abnormality. Adrenals/Urinary Tract: Focal area of macroscopic fat in the left adrenal gland, unchanged from previous and consistent with a myelolipoma. The right adrenal gland is preserved. Once again there is enhancing mass along the medial aspect of the right kidney. Previously measuring 4.5 x 3.2 cm and today 4.3 by 3.2 cm consistent with known renal cell carcinoma. No separate new enhancing renal mass or collecting system dilatation. Preserved contours of the underdistended urinary bladder. Stomach/Bowel: On this non oral contrast exam the bowel is nondilated. There are some scattered stool. The stomach is underdistended with fluid. Vascular/Lymphatic: Diffuse vascular calcifications identified. There are areas of stenosis suggested along the iliac vessels bilaterally  including the common iliac arteries. Please correlate with symptoms. 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 Enlarged left external iliac chain lymph node, unchanged from previous measuring 14 by 13 mm on series 2, image 124. Reproductive: Heterogeneous prostate Other: Mild anasarca.  No free air or free fluid.  Diffuse motion Musculoskeletal: Curvature and degenerative changes seen along the spine. IMPRESSION: Large area of consolidative opacity along the left lower lobe of the lung worrisome for pneumonia. Recommend follow-up. Enlarged left hilar and mediastinal lymph nodes. These could be reactive but would recommend attention on follow-up after clearance of the pneumonia. Known right-sided renal cell carcinoma again identified. Stable enlarged left external iliac chain lymph node. No bowel obstruction, free air or free fluid. Diffuse motion. Electronically Signed   By: Karen Kays M.D.   On: 08/15/2022 14:59   DG Chest 2 View  Result Date: 08/15/2022 CLINICAL DATA:  Shortness of breath EXAM: CHEST - 2 VIEW COMPARISON:  None Available. FINDINGS: No pleural effusion. No pneumothorax. There is a large area of reticulonodular airspace opacity in the left mid and lower lung fields, suspicious for infection. Normal cardiac and mediastinal contours. No radiographically apparent displaced rib fractures. Visualized upper abdomen is unremarkable. IMPRESSION: Large area of reticulonodular airspace opacity in the left mid and lower lung fields, suspicious for infection. Recommend further evaluation with a chest CT for more definitive characterization. Electronically Signed   By: Lorenza Cambridge M.D.   On: 08/15/2022 13:35    Labs on Admission: I have personally reviewed following labs  CBC: Recent Labs  Lab 08/15/22 1252  WBC 22.6*  HGB 18.6*  HCT 54.9*  MCV 93.5  PLT 325   Basic Metabolic Panel: Recent Labs  Lab  08/15/22 1252  NA 134*  K 5.1  CL 97*  CO2 21*  GLUCOSE 102*  BUN 32*  CREATININE 2.24*  CALCIUM 9.9   GFR: CrCl cannot be calculated (Unknown ideal weight.).  Liver Function Tests: Recent Labs  Lab 08/15/22 1252  AST 28  ALT 14  ALKPHOS 61  BILITOT 1.7*  PROT 7.2  ALBUMIN 3.8   Recent Labs  Lab 08/15/22 1252  LIPASE 21   Urine analysis:    Component Value Date/Time   COLORURINE AMBER (A) 08/15/2022 1347   APPEARANCEUR CLOUDY (A) 08/15/2022 1347   LABSPEC 1.030 08/15/2022 1347   PHURINE 5.0 08/15/2022 1347   GLUCOSEU 50 (A) 08/15/2022 1347   HGBUR SMALL (A) 08/15/2022 1347   BILIRUBINUR SMALL (A) 08/15/2022 1347   KETONESUR NEGATIVE 08/15/2022 1347   PROTEINUR 100 (A) 08/15/2022 1347   NITRITE NEGATIVE 08/15/2022 1347   LEUKOCYTESUR NEGATIVE 08/15/2022 1347   This document was prepared using Dragon Voice Recognition software and  may include unintentional dictation errors.  Dr. Sedalia Muta [facility]  If 7PM-7AM, please contact overnight-coverage provider If 7AM-7PM, please contact day attending provider [email or web address]  08/15/2022, 3:56 PM

## 2022-08-15 NOTE — ED Provider Notes (Signed)
[facility] Provider Note    Event Date/Time   First MD Initiated Contact with Patient 08/15/22 1321     (approximate)   History   Emesis   HPI  Derek Henderson is a 77 y.o. male with a history of COPD, renal cell carcinoma, peripheral arterial disease presents with complaints of cough, chills, nausea.  Patient is very hard of hearing, wife is providing much of the history     Physical Exam   Triage Vital Signs: ED Triage Vitals  Enc Vitals Group     BP 08/15/22 1241 120/61     Pulse Rate 08/15/22 1241 (!) 106     Resp 08/15/22 1241 18     Temp 08/15/22 1241 97.9 F (36.6 C)     Temp Source 08/15/22 1241 Oral     SpO2 08/15/22 1241 94 %     Weight --      Height --      Head Circumference --      Peak Flow --      Pain Score 08/15/22 1238 7     Pain Loc --      Pain Edu? --      Excl. in GC? --     Most recent vital signs: Vitals:   08/15/22 1241  BP: 120/61  Pulse: (!) 106  Resp: 18  Temp: 97.9 F (36.6 C)  SpO2: 94%     General: Awake, cachectic CV:  Good peripheral perfusion.  Mild tachycardia Resp:  Bibasilar Rales Abd:  No distention.  Overall soft, nontender Other:     ED Results / Procedures / Treatments   Labs (all labs ordered are listed, but only abnormal results are displayed) Labs Reviewed  COMPREHENSIVE METABOLIC PANEL - Abnormal; Notable for the following components:      Result Value   Sodium 134 (*)    Chloride 97 (*)    CO2 21 (*)    Glucose, Bld 102 (*)    BUN 32 (*)    Creatinine, Ser 2.24 (*)    Total Bilirubin 1.7 (*)    GFR, Estimated 30 (*)    Anion gap 16 (*)    All other components within normal limits  CBC - Abnormal; Notable for the following components:   WBC 22.6 (*)    RBC 5.87 (*)    Hemoglobin 18.6 (*)    HCT 54.9 (*)    All other components within normal limits  URINALYSIS, ROUTINE W REFLEX MICROSCOPIC - Abnormal; Notable for the following components:   Color, Urine AMBER  (*)    APPearance CLOUDY (*)    Glucose, UA 50 (*)    Hgb urine dipstick SMALL (*)    Bilirubin Urine SMALL (*)    Protein, ur 100 (*)    Bacteria, UA MANY (*)    Non Squamous Epithelial PRESENT (*)    All other components within normal limits  LACTIC ACID, PLASMA - Abnormal; Notable for the following components:   Lactic Acid, Venous 4.5 (*)    All other components within normal limits  TROPONIN I (HIGH SENSITIVITY) - Abnormal; Notable for the following components:   Troponin I (High Sensitivity) 25 (*)    All other components within normal limits  CULTURE, BLOOD (ROUTINE X 2)  CULTURE, BLOOD (ROUTINE X 2)  LIPASE, BLOOD  LACTIC ACID, PLASMA  TROPONIN I (HIGH SENSITIVITY)     EKG ED ECG REPORT I, Jene Every, the attending physician, personally viewed  and interpreted this ECG.  Date: 08/15/2022  Rhythm: Sinus tachycardia QRS Axis: normal Intervals: normal ST/T Wave abnormalities: normal Narrative Interpretation: no evidence of acute ischemia    RADIOLOGY Chest x-ray viewed interpret by me    PROCEDURES:  Critical Care performed: yes  CRITICAL CARE Performed by: Jene Every   Total critical care time: 30 minutes  Critical care time was exclusive of separately billable procedures and treating other patients.  Critical care was necessary to treat or prevent imminent or life-threatening deterioration.  Critical care was time spent personally by me on the following activities: development of treatment plan with patient and/or surrogate as well as nursing, discussions with consultants, evaluation of patient's response to treatment, examination of patient, obtaining history from patient or surrogate, ordering and performing treatments and interventions, ordering and review of laboratory studies, ordering and review of radiographic studies, pulse oximetry and re-evaluation of patient's condition.   Procedures   MEDICATIONS ORDERED IN ED: Medications   lactated ringers infusion ( Intravenous New Bag/Given 08/15/22 1404)  cefTRIAXone (ROCEPHIN) 2 g in sodium chloride 0.9 % 100 mL IVPB (0 g Intravenous Stopped 08/15/22 1426)  azithromycin (ZITHROMAX) 500 mg in sodium chloride 0.9 % 250 mL IVPB (500 mg Intravenous New Bag/Given 08/15/22 1411)  0.9 %  sodium chloride infusion (has no administration in time range)  sodium chloride 0.9 % bolus 250 mL (has no administration in time range)  sodium chloride 0.9 % bolus 1,000 mL (0 mLs Intravenous Stopped 08/15/22 1454)  iohexol (OMNIPAQUE) 300 MG/ML solution 75 mL (75 mLs Intravenous Contrast Given 08/15/22 1425)     IMPRESSION / MDM / ASSESSMENT AND PLAN / ED COURSE  I reviewed the triage vital signs and the nursing notes. Patient's presentation is most consistent with acute presentation with potential threat to life or bodily function.  Patient presents with symptoms as above, tachycardic, oxygen saturation 94%, multiple comorbidities.  Given reports of chills concerning for possible sepsis, pneumonia, viral gastroenteritis.  Abdominal exam is overall reassuring.  Pending labs  White blood cell count is elevated at 22,000, will add on lactic and blood cultures  ----------------------------------------- 1:40 PM on 08/15/2022 ----------------------------------------- Chest x-ray is consistent with pneumonia, will start broad-spectrum antibiotics, radiology recommends CT chest.  Given his description of some abdominal discomfort and nausea vomiting yesterday will add on abdomen pelvis as well ----------------------------------------- 3:05 PM on 08/15/2022 ----------------------------------------- Notified of lactic greater than 4, will give an additional 1250 mL of fluid to complete 30 mL/kg.  CT scan is most consistent with pneumonia, patient is already receiving IV antibiotics,  Will discuss with the hospitalist for admission      FINAL CLINICAL IMPRESSION(S) / ED DIAGNOSES   Final diagnoses:   Sepsis, due to unspecified organism, unspecified whether acute organ dysfunction present (HCC)  Pneumonia of left lung due to infectious organism, unspecified part of lung     Rx / DC Orders   ED Discharge Orders     None        Note:  This document was prepared using Dragon voice recognition software and may include unintentional dictation errors.   Jene Every, MD 08/15/22 [phone]

## 2022-08-15 NOTE — Hospital Course (Signed)
Mr. Derek Henderson is a 77 year old male with history of COPD, hyperlipidemia, GERD, clear-cell carcinoma of the right kidney, PAD, malignant melanoma, pan lobar emphysema, who presents emergency department for chief concerns of shortness of breath, nausea, vomiting, diarrhea.  Vitals in the ED showed temperature of 97.9, respiration rate of 18, heart rate of 106, blood pressure 120/61, SpO2 94% on room air.  Serum sodium is 134, potassium 5.1, chloride 97, bicarb 21, BUN of 32, serum creatinine of 2.24, EGFR 30, nonfasting blood glucose 102, WBC 22.6, hemoglobin 18.6, platelets of 325.  High sensitive troponin is 25.  Lactic acid is initially 4.5 and on repeat was 3.8.  UA was negative for leukocytes and nitrates.  Blood cultures are collected and are in process.  ED treatment: Sodium chloride 1 L bolus x 1, 250 mL bolus ordered and pending administration, ceftriaxone 2 g IV daily, azithromycin 500 mg IV, daily for 5-day course were ordered by EDP

## 2022-08-15 NOTE — ED Notes (Signed)
Pt being transported up to admission room by Burt, NT.

## 2022-08-15 NOTE — ED Notes (Signed)
Date and time results received: 08/15/22 2:56 PM  (use smartphrase ".now" to insert current time)  Test: Lactic Acid Critical Value: 4.5  Name of Provider Notified: Dr. Cyril Loosen   Orders Received? Or Actions Taken?:  EDP on unit. No new orders at this time.

## 2022-08-15 NOTE — ED Notes (Signed)
Patient is A&Ox4. Pt is also hard of hearing. Wife was at bedside but went home to get hearing aids and some other essentials for patient and will be returning.

## 2022-08-15 NOTE — Assessment & Plan Note (Addendum)
Status post sodium chloride 1 L bolus, sodium chloride 250 mL bolus per EDP Ordered additional sodium chloride 1 L bolus on admission Continue with LR 150 mL/h, 20 hours ordered Blood cultures x 2 are in progress Continue with ceftriaxone and azithromycin DuoNebs every 6 hours as needed for wheezing and shortness of breath, 18 hours ordered Check procalcitonin stat on admission and repeat procalcitonin in the a.m. to ensure appropriate antibiotic therapy Admit to PCU, inpatient

## 2022-08-15 NOTE — Code Documentation (Signed)
CODE SEPSIS - PHARMACY COMMUNICATION  **Broad Spectrum Antibiotics should be administered within 1 hour of Sepsis diagnosis**  Time Code Sepsis Called/Page Received: 1340  Antibiotics Ordered: ceftriaxone, azithromycin   Time of 1st antibiotic administration: 1355  Additional action taken by pharmacy:   If necessary, Name of Provider/Nurse Contacted:     Sharen Hones ,PharmD Clinical Pharmacist  08/15/2022  1:42 PM

## 2022-08-15 NOTE — ED Triage Notes (Signed)
Patient to ED via POV sent from Dr Garen Lah office for N/V/D that started last night. Dr Hyacinth Meeker states new dyspnea with a hx of COPD. Takes he has had fever and chills. Pain on left side and in chest.

## 2022-08-15 NOTE — Assessment & Plan Note (Addendum)
Presumed secondary to severe sepsis, no CKD at baseline Treat per sepsis Repeat BMP in the a.m.

## 2022-08-15 NOTE — Assessment & Plan Note (Signed)
Hydralazine 5 mg IV every 8 hours as needed for SBP greater 180, 4 days ordered

## 2022-08-15 NOTE — Assessment & Plan Note (Signed)
With markedly elevated leukocytosis Check C. difficile screening and GI panel screening on admission No antidiarrheal medication will be given until the above have resulted as negative

## 2022-08-16 DIAGNOSIS — R6520 Severe sepsis without septic shock: Secondary | ICD-10-CM | POA: Diagnosis not present

## 2022-08-16 DIAGNOSIS — A419 Sepsis, unspecified organism: Secondary | ICD-10-CM | POA: Diagnosis not present

## 2022-08-16 LAB — PROTIME-INR
INR: 1.5 — ABNORMAL HIGH (ref 0.8–1.2)
Prothrombin Time: 17.6 seconds — ABNORMAL HIGH (ref 11.4–15.2)

## 2022-08-16 LAB — CBC
HCT: 43.8 % (ref 39.0–52.0)
Hemoglobin: 14.8 g/dL (ref 13.0–17.0)
MCH: 31.2 pg (ref 26.0–34.0)
MCHC: 33.8 g/dL (ref 30.0–36.0)
MCV: 92.4 fL (ref 80.0–100.0)
Platelets: 232 10*3/uL (ref 150–400)
RBC: 4.74 MIL/uL (ref 4.22–5.81)
RDW: 13.2 % (ref 11.5–15.5)
WBC: 18.7 10*3/uL — ABNORMAL HIGH (ref 4.0–10.5)
nRBC: 0 % (ref 0.0–0.2)

## 2022-08-16 LAB — BASIC METABOLIC PANEL
Anion gap: 8 (ref 5–15)
BUN: 28 mg/dL — ABNORMAL HIGH (ref 8–23)
CO2: 24 mmol/L (ref 22–32)
Calcium: 8.6 mg/dL — ABNORMAL LOW (ref 8.9–10.3)
Chloride: 101 mmol/L (ref 98–111)
Creatinine, Ser: 1.05 mg/dL (ref 0.61–1.24)
GFR, Estimated: >60 mL/min (ref >60)
Glucose, Bld: 100 mg/dL — ABNORMAL HIGH (ref 70–99)
Potassium: 4 mmol/L (ref 3.5–5.1)
Sodium: 133 mmol/L — ABNORMAL LOW (ref 135–145)

## 2022-08-16 LAB — CULTURE, BLOOD (ROUTINE X 2): Culture: NO GROWTH

## 2022-08-16 LAB — LACTIC ACID, PLASMA: Lactic Acid, Venous: 1.8 mmol/L (ref 0.5–1.9)

## 2022-08-16 LAB — PROCALCITONIN: Procalcitonin: 28.66 ng/mL

## 2022-08-16 MED ORDER — IPRATROPIUM-ALBUTEROL 0.5-2.5 (3) MG/3ML IN SOLN
3.0000 mL | Freq: Four times a day (QID) | RESPIRATORY_TRACT | Status: DC | PRN
  Administered 2022-08-19: 3 mL via RESPIRATORY_TRACT
  Filled 2022-08-16: qty 3

## 2022-08-16 MED ORDER — ATORVASTATIN CALCIUM 10 MG PO TABS
10.0000 mg | ORAL_TABLET | Freq: Every day | ORAL | Status: DC
  Administered 2022-08-16 – 2022-08-20 (×5): 10 mg via ORAL
  Filled 2022-08-16 (×5): qty 1

## 2022-08-16 MED ORDER — AZITHROMYCIN 250 MG PO TABS
500.0000 mg | ORAL_TABLET | Freq: Every day | ORAL | Status: AC
  Administered 2022-08-17 – 2022-08-19 (×3): 500 mg via ORAL
  Filled 2022-08-16 (×3): qty 2

## 2022-08-16 MED ORDER — ASPIRIN 81 MG PO TBEC
81.0000 mg | DELAYED_RELEASE_TABLET | Freq: Every day | ORAL | Status: DC
  Administered 2022-08-16 – 2022-08-20 (×5): 81 mg via ORAL
  Filled 2022-08-16 (×5): qty 1

## 2022-08-16 MED ORDER — LACTATED RINGERS IV SOLN: INTRAVENOUS | Status: DC

## 2022-08-16 MED ORDER — CLOPIDOGREL BISULFATE 75 MG PO TABS
75.0000 mg | ORAL_TABLET | Freq: Every day | ORAL | Status: DC
  Administered 2022-08-16 – 2022-08-20 (×5): 75 mg via ORAL
  Filled 2022-08-16 (×5): qty 1

## 2022-08-16 NOTE — Plan of Care (Signed)

## 2022-08-16 NOTE — Progress Notes (Signed)
PHARMACIST - PHYSICIAN COMMUNICATION  CONCERNING: Antibiotic IV to Oral Route Change Policy  RECOMMENDATION: This patient is receiving azithromycin by the intravenous route.  Based on criteria approved by the Pharmacy and Therapeutics Committee, the antibiotic(s) is/are being converted to the equivalent oral dose form(s).  DESCRIPTION: These criteria include: Patient being treated for a respiratory tract infection, urinary tract infection, cellulitis or clostridium difficile associated diarrhea if on metronidazole The patient is not neutropenic and does not exhibit a GI malabsorption state The patient is eating (either orally or via tube) and/or has been taking other orally administered medications for a least 24 hours The patient is improving clinically and has a Tmax < 100.5  If you have questions about this conversion, please contact the Pharmacy Department   Tressie Ellis 08/16/22

## 2022-08-16 NOTE — Progress Notes (Signed)
PROGRESS NOTE    Derek Henderson  ZOX:[id number] DOB: [birth year] DOA: 08/15/2022 PCP: Danella Penton, MD    Brief Narrative:  77 year old male with history of COPD, hyperlipidemia, GERD, clear-cell carcinoma of the right kidney, PAD, malignant melanoma, pan lobar emphysema, who presents emergency department for chief concerns of shortness of breath, nausea, vomiting, diarrhea. Reports he started having worsening shortness of breath last night.  He states he has been having chronic shortness of breath for 6 years.   He denies known fever, new cough, chest pain, shortness of breath, abdominal pain, dysuria, hematuria, nausea, vomiting.  He denies diarrhea.  He denies weakness or change to appetite.   He reports he does not wear oxygen at baseline.   Assessment & Plan:   Principal Problem:   Severe sepsis (HCC) Active Problems:   History of malignant melanoma   Panlobular emphysema (HCC)   Tobacco abuse   Peripheral artery disease (HCC)   Atherosclerosis of native arteries of extremity with intermittent claudication (HCC)   Benign essential hypertension   AKI (acute kidney injury) (HCC)   Diarrhea  * Severe sepsis (HCC) Secondary to community-acquired pneumonia Status post sodium chloride 1 L bolus, sodium chloride 250 mL bolus per EDP Ordered additional sodium chloride 1 L bolus on admission Plan: Continue with Rocephin/azithromycin Bronchodilators IV fluids Trend labs Monitor vitals and fever curve  Diarrhea With markedly elevated leukocytosis Diarrhea has resolved.  Low suspicion for infectious diarrhea at this time   AKI (acute kidney injury) (HCC) Presumed secondary to severe sepsis, no CKD at baseline Treat per sepsis Creatinine improving   Benign essential hypertension Hydralazine 5 mg IV every 8 hours as needed for SBP greater 180, 4 days ordered   Tobacco abuse As needed nicotine patch ordered for nicotine craving   DVT prophylaxis: SQ heparin Code  Status: Full Family Communication: Disposition Plan: Status is: Inpatient Remains inpatient appropriate because: Severe community-acquired pneumonia on IV antibiotics   Level of care: Progressive  Consultants:  None  Procedures:  None  Antimicrobials: Ceftriaxone Azithromycin   Subjective: Seen and examined.  Resting in bed.  No acute distress.  Normal work of breathing.  Objective: Vitals:   08/15/22 2333 08/16/22 0431 08/16/22 0821 08/16/22 1120  BP: (!) 154/58 124/82 (!) 148/62 124/61  Pulse: 90 88 92 83  Resp: 18 18 (!) 22 18  Temp: 98.7 F (37.1 C) 97.8 F (36.6 C) 98.1 F (36.7 C) 98.3 F (36.8 C)  TempSrc:    Oral  SpO2: 91% 93% 95% 95%    Intake/Output Summary (Last 24 hours) at 08/16/2022 1321 Last data filed at 08/16/2022 1120 Gross per 24 hour  Intake 5739.86 ml  Output 750 ml  Net 4989.86 ml   There were no vitals filed for this visit.  Examination:  General exam: NAD.  Somewhat frail-appearing Respiratory system: Decreased lung sounds bilaterally.  Scattered crackles on the left.  Normal work of breathing.  2 L Cardiovascular system: S1-S2, RRR, no murmurs, no pedal edema Gastrointestinal system: Thin,, NT/ND, normal bowel sounds Central nervous system: Alert and oriented. No focal neurological deficits. Extremities: Symmetric 5 x 5 power. Skin: No rashes, lesions or ulcers Psychiatry: Judgement and insight appear normal. Mood & affect appropriate.     Data Reviewed: I have personally reviewed following labs and imaging studies  CBC: Recent Labs  Lab 08/15/22 1252 08/16/22 0635  WBC 22.6* 18.7*  HGB 18.6* 14.8  HCT 54.9* 43.8  MCV 93.5 92.4  PLT 325 232  Basic Metabolic Panel: Recent Labs  Lab 08/15/22 1252 08/16/22 0635  NA 134* 133*  K 5.1 4.0  CL 97* 101  CO2 21* 24  GLUCOSE 102* 100*  BUN 32* 28*  CREATININE 2.24* 1.05  CALCIUM 9.9 8.6*   GFR: CrCl cannot be calculated (Unknown ideal weight.). Liver Function  Tests: Recent Labs  Lab 08/15/22 1252  AST 28  ALT 14  ALKPHOS 61  BILITOT 1.7*  PROT 7.2  ALBUMIN 3.8   Recent Labs  Lab 08/15/22 1252  LIPASE 21   No results for input(s): "AMMONIA" in the last 168 hours. Coagulation Profile: Recent Labs  Lab 08/16/22 0635  INR 1.5*   Cardiac Enzymes: No results for input(s): "CKTOTAL", "CKMB", "CKMBINDEX", "TROPONINI" in the last 168 hours. BNP (last 3 results) No results for input(s): "PROBNP" in the last 8760 hours. HbA1C: No results for input(s): "HGBA1C" in the last 72 hours. CBG: No results for input(s): "GLUCAP" in the last 168 hours. Lipid Profile: No results for input(s): "CHOL", "HDL", "LDLCALC", "TRIG", "CHOLHDL", "LDLDIRECT" in the last 72 hours. Thyroid Function Tests: No results for input(s): "TSH", "T4TOTAL", "FREET4", "T3FREE", "THYROIDAB" in the last 72 hours. Anemia Panel: No results for input(s): "VITAMINB12", "FOLATE", "FERRITIN", "TIBC", "IRON", "RETICCTPCT" in the last 72 hours. Sepsis Labs: Recent Labs  Lab 08/15/22 1346 08/15/22 1514 08/16/22 0635 08/16/22 0804  PROCALCITON  --  40.79 28.66  --   LATICACIDVEN 4.5* 3.8*  --  1.8    Recent Results (from the past 240 hour(s))  Blood culture (routine x 2)     Status: None (Preliminary result)   Collection Time: 08/15/22  1:46 PM   Specimen: BLOOD  Result Value Ref Range Status   Specimen Description BLOOD BLOOD LEFT ARM  Final   Special Requests   Final    BOTTLES DRAWN AEROBIC AND ANAEROBIC Blood Culture adequate volume   Culture   Final    NO GROWTH < 24 HOURS Performed at [facility] [address] [place], [place] [address]    Report Status PENDING  Incomplete  Blood culture (routine x 2)     Status: None (Preliminary result)   Collection Time: 08/15/22  1:46 PM   Specimen: BLOOD  Result Value Ref Range Status   Specimen Description BLOOD BLOOD RIGHT ARM  Final   Special Requests   Final    BOTTLES DRAWN AEROBIC AND ANAEROBIC  Blood Culture adequate volume   Culture   Final    NO GROWTH < 24 HOURS Performed at [facility], [address], [place], [place] [address]    Report Status PENDING  Incomplete         Radiology Studies: CT CHEST ABDOMEN PELVIS W CONTRAST  Result Date: 08/15/2022 CLINICAL DATA:  Sepsis.  History of COPD.  New onset dyspnea EXAM: CT CHEST, ABDOMEN, AND PELVIS WITH CONTRAST TECHNIQUE: Multidetector CT imaging of the chest, abdomen and pelvis was performed following the standard protocol during bolus administration of intravenous contrast. RADIATION DOSE REDUCTION: This exam was performed according to the departmental dose-optimization program which includes automated exposure control, adjustment of the mA and/or kV according to patient size and/or use of iterative reconstruction technique. CONTRAST:  75mL OMNIPAQUE IOHEXOL 300 MG/ML  SOLN COMPARISON:  Prior chest CT scan 12/04/2018. Abdomen pelvis CT scan 01/09/2021. Other comparison exams. FINDINGS: CT CHEST FINDINGS Cardiovascular: Heart is nonenlarged. Trace pericardial fluid. Coronary artery calcifications are seen. The thoracic aorta has a normal course and caliber with scattered mild  atherosclerotic calcified plaque. Mediastinum/Nodes: Normal caliber thoracic esophagus. Preserved thyroid gland. No specific abnormal lymph node enlargement present in the axillary region, right hilum. Right hilar node on series 2, image 35 measures 18 by 13 mm. There are some prominent mediastinal nodes. Example precarinal on series 2 image 30 measures 20 by 15 mm. This has increased in size from previous. There also is an enlarging subcarinal node measuring 3.6 by 1.3 cm on series 2, image 37. Lungs/Pleura: Advanced centrilobular emphysematous lung changes are identified, greatest in the upper lung zones. Consolidative opacity seen along the left lower lobe with some interstitial components. Acute infiltrate is possible such as pneumonia. No significant  effusion. No additional areas of consolidation. No pneumothorax. Musculoskeletal: Chronic left upper chest rib deformities. Healed fractures. CT ABDOMEN PELVIS FINDINGS Hepatobiliary: No focal liver abnormality is seen. No gallstones, gallbladder wall thickening, or biliary dilatation. Pancreas: Global pancreatic atrophy, unchanged from previous. Spleen: Normal in size without focal abnormality. Adrenals/Urinary Tract: Focal area of macroscopic fat in the left adrenal gland, unchanged from previous and consistent with a myelolipoma. The right adrenal gland is preserved. Once again there is enhancing mass along the medial aspect of the right kidney. Previously measuring 4.5 x 3.2 cm and today 4.3 by 3.2 cm consistent with known renal cell carcinoma. No separate new enhancing renal mass or collecting system dilatation. Preserved contours of the underdistended urinary bladder. Stomach/Bowel: On this non oral contrast exam the bowel is nondilated. There are some scattered stool. The stomach is underdistended with fluid. Vascular/Lymphatic: Diffuse vascular calcifications identified. There are areas of stenosis suggested along the iliac vessels bilaterally including the common iliac arteries. Please correlate with symptoms. 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 Enlarged left external iliac chain lymph node, unchanged from previous measuring 14 by 13 mm on series 2, image 124. Reproductive: Heterogeneous prostate Other: Mild anasarca.  No free air or free fluid.  Diffuse motion Musculoskeletal: Curvature and degenerative changes seen along the spine. IMPRESSION: Large area of consolidative opacity along the left lower lobe of the lung worrisome for pneumonia. Recommend follow-up. Enlarged left hilar and mediastinal lymph nodes. These could be reactive but would recommend attention on follow-up after clearance of the pneumonia. Known  right-sided renal cell carcinoma again identified. Stable enlarged left external iliac chain lymph node. No bowel obstruction, free air or free fluid. Diffuse motion. Electronically Signed   By: Karen Kays M.D.   On: 08/15/2022 14:59   DG Chest 2 View  Result Date: 08/15/2022 CLINICAL DATA:  Shortness of breath EXAM: CHEST - 2 VIEW COMPARISON:  None Available. FINDINGS: No pleural effusion. No pneumothorax. There is a large area of reticulonodular airspace opacity in the left mid and lower lung fields, suspicious for infection. Normal cardiac and mediastinal contours. No radiographically apparent displaced rib fractures. Visualized upper abdomen is unremarkable. IMPRESSION: Large area of reticulonodular airspace opacity in the left mid and lower lung fields, suspicious for infection. Recommend further evaluation with a chest CT for more definitive characterization. Electronically Signed   By: Lorenza Cambridge M.D.   On: 08/15/2022 13:35        Scheduled Meds:  heparin  5,000 Units Subcutaneous Q8H   pantoprazole  80 mg Oral Daily   Continuous Infusions:  azithromycin 500 mg (08/16/22 1311)   cefTRIAXone (ROCEPHIN)  IV Stopped (08/15/22 1426)   lactated ringers 75 mL/hr at 08/16/22 [phone]  LOS: 1 day    Tresa Moore, MD [facility]   If 7PM-7AM, please contact night-coverage  08/16/2022, 1:21 PM

## 2022-08-17 DIAGNOSIS — R6520 Severe sepsis without septic shock: Secondary | ICD-10-CM | POA: Diagnosis not present

## 2022-08-17 DIAGNOSIS — A419 Sepsis, unspecified organism: Secondary | ICD-10-CM | POA: Diagnosis not present

## 2022-08-17 LAB — CBC WITH DIFFERENTIAL/PLATELET
Abs Immature Granulocytes: 0.32 10*3/uL — ABNORMAL HIGH (ref 0.00–0.07)
Basophils Absolute: 0 10*3/uL (ref 0.0–0.1)
Basophils Relative: 0 %
Eosinophils Absolute: 0 10*3/uL (ref 0.0–0.5)
Eosinophils Relative: 0 %
HCT: 38.1 % — ABNORMAL LOW (ref 39.0–52.0)
Hemoglobin: 12.8 g/dL — ABNORMAL LOW (ref 13.0–17.0)
Immature Granulocytes: 2 %
Lymphocytes Relative: 5 %
Lymphs Abs: 0.8 10*3/uL (ref 0.7–4.0)
MCH: 31.5 pg (ref 26.0–34.0)
MCHC: 33.6 g/dL (ref 30.0–36.0)
MCV: 93.8 fL (ref 80.0–100.0)
Monocytes Absolute: 0.5 10*3/uL (ref 0.1–1.0)
Monocytes Relative: 3 %
Neutro Abs: 15 10*3/uL — ABNORMAL HIGH (ref 1.7–7.7)
Neutrophils Relative %: 90 %
Platelets: 247 10*3/uL (ref 150–400)
RBC: 4.06 MIL/uL — ABNORMAL LOW (ref 4.22–5.81)
RDW: 13.2 % (ref 11.5–15.5)
Smear Review: UNDETERMINED
WBC: 16.6 10*3/uL — ABNORMAL HIGH (ref 4.0–10.5)
nRBC: 0 % (ref 0.0–0.2)

## 2022-08-17 LAB — BASIC METABOLIC PANEL
Anion gap: 7 (ref 5–15)
BUN: 15 mg/dL (ref 8–23)
CO2: 25 mmol/L (ref 22–32)
Calcium: 8.9 mg/dL (ref 8.9–10.3)
Chloride: 101 mmol/L (ref 98–111)
Creatinine, Ser: 0.83 mg/dL (ref 0.61–1.24)
GFR, Estimated: >60 mL/min (ref >60)
Glucose, Bld: 81 mg/dL (ref 70–99)
Potassium: 3.9 mmol/L (ref 3.5–5.1)
Sodium: 133 mmol/L — ABNORMAL LOW (ref 135–145)

## 2022-08-17 LAB — CULTURE, BLOOD (ROUTINE X 2): Culture: NO GROWTH

## 2022-08-17 NOTE — Progress Notes (Signed)
PROGRESS NOTE    Derek Henderson  ZOX:[id number] DOB: May 24, 1945 DOA: 08/15/2022 PCP: Danella Penton, MD    Brief Narrative:   77 year old male with history of COPD, hyperlipidemia, GERD, clear-cell carcinoma of the right kidney, PAD, malignant melanoma, pan lobar emphysema, who presents emergency department for chief concerns of shortness of breath, nausea, vomiting, diarrhea. Reports he started having worsening shortness of breath last night.  He states he has been having chronic shortness of breath for 6 years.   He denies known fever, new cough, chest pain, shortness of breath, abdominal pain, dysuria, hematuria, nausea, vomiting.  He denies diarrhea.  He denies weakness or change to appetite.   He reports he does not wear oxygen at baseline.   Assessment & Plan:   Principal Problem:   Severe sepsis (HCC) Active Problems:   History of malignant melanoma   Panlobular emphysema (HCC)   Tobacco abuse   Peripheral artery disease (HCC)   Atherosclerosis of native arteries of extremity with intermittent claudication (HCC)   Benign essential hypertension   AKI (acute kidney injury) (HCC)   Diarrhea  * Severe sepsis (HCC) Secondary to community-acquired pneumonia Status post sodium chloride 1 L bolus, sodium chloride 250 mL bolus per EDP Ordered additional sodium chloride 1 L bolus on admission Plan: Continue with Rocephin/azithromycin Bronchodilators DC IVF Trend labs Monitor vitals and fever curve  Diarrhea With markedly elevated leukocytosis Diarrhea has resolved.  Low suspicion for infectious diarrhea at this time   AKI (acute kidney injury) (HCC) Presumed secondary to severe sepsis, no CKD at baseline Treat per sepsis Creatinine improving DC IVF 5/4 Continue daily labs   Benign essential hypertension Hydralazine 5 mg IV every 8 hours as needed for SBP greater 180, 4 days ordered   Tobacco abuse As needed nicotine patch ordered for nicotine craving   DVT  prophylaxis: SQ heparin Code Status: Full Family Communication: Wife and granddaughter at bedside 5/3 Disposition Plan: Status is: Inpatient Remains inpatient appropriate because: Severe community-acquired pneumonia on IV antibiotics   Level of care: Progressive  Consultants:  None  Procedures:  None  Antimicrobials: Ceftriaxone Azithromycin   Subjective: Patient seen and examined.  Resting comfortably in bed.  Starting to feel better.  No distress.  Normal work of breathing  Objective: Vitals:   08/17/22 0012 08/17/22 0404 08/17/22 0928 08/17/22 1213  BP: 124/61 (!) 142/63 (!) 149/66 (!) 148/66  Pulse: 83 67 73 66  Resp: 17 18 20  (!) 22  Temp: 98.6 F (37 C) 98.6 F (37 C) 98.2 F (36.8 C) 98.1 F (36.7 C)  TempSrc: Oral     SpO2: 95% 93% 96% 96%    Intake/Output Summary (Last 24 hours) at 08/17/2022 1355 Last data filed at 08/17/2022 1000 Gross per 24 hour  Intake 2529.5 ml  Output 1700 ml  Net 829.5 ml   There were no vitals filed for this visit.  Examination:  General exam: NAD.  Somewhat frail-appearing Respiratory system: Left-sided crackles.  Normal work of breathing.  2 L Cardiovascular system: S1-S2, RRR, no murmurs, no pedal edema Gastrointestinal system: Thin,, NT/ND, normal bowel sounds Central nervous system: Alert and oriented. No focal neurological deficits. Extremities: Symmetric 5 x 5 power. Skin: No rashes, lesions or ulcers Psychiatry: Judgement and insight appear normal. Mood & affect appropriate.     Data Reviewed: I have personally reviewed following labs and imaging studies  CBC: Recent Labs  Lab 08/15/22 1252 08/16/22 0635 08/17/22 0804  WBC 22.6* 18.7* 16.6*  NEUTROABS  --   --  15.0*  HGB 18.6* 14.8 12.8*  HCT 54.9* 43.8 38.1*  MCV 93.5 92.4 93.8  PLT 325 232 247   Basic Metabolic Panel: Recent Labs  Lab 08/15/22 1252 08/16/22 0635 08/17/22 0804  NA 134* 133* 133*  K 5.1 4.0 3.9  CL 97* 101 101  CO2 21* 24 25   GLUCOSE 102* 100* 81  BUN 32* 28* 15  CREATININE 2.24* 1.05 0.83  CALCIUM 9.9 8.6* 8.9   GFR: CrCl cannot be calculated (Unknown ideal weight.). Liver Function Tests: Recent Labs  Lab 08/15/22 1252  AST 28  ALT 14  ALKPHOS 61  BILITOT 1.7*  PROT 7.2  ALBUMIN 3.8   Recent Labs  Lab 08/15/22 1252  LIPASE 21   No results for input(s): "AMMONIA" in the last 168 hours. Coagulation Profile: Recent Labs  Lab 08/16/22 0635  INR 1.5*   Cardiac Enzymes: No results for input(s): "CKTOTAL", "CKMB", "CKMBINDEX", "TROPONINI" in the last 168 hours. BNP (last 3 results) No results for input(s): "PROBNP" in the last 8760 hours. HbA1C: No results for input(s): "HGBA1C" in the last 72 hours. CBG: No results for input(s): "GLUCAP" in the last 168 hours. Lipid Profile: No results for input(s): "CHOL", "HDL", "LDLCALC", "TRIG", "CHOLHDL", "LDLDIRECT" in the last 72 hours. Thyroid Function Tests: No results for input(s): "TSH", "T4TOTAL", "FREET4", "T3FREE", "THYROIDAB" in the last 72 hours. Anemia Panel: No results for input(s): "VITAMINB12", "FOLATE", "FERRITIN", "TIBC", "IRON", "RETICCTPCT" in the last 72 hours. Sepsis Labs: Recent Labs  Lab 08/15/22 1346 08/15/22 1514 08/16/22 0635 08/16/22 0804  PROCALCITON  --  40.79 28.66  --   LATICACIDVEN 4.5* 3.8*  --  1.8    Recent Results (from the past 240 hour(s))  Blood culture (routine x 2)     Status: None (Preliminary result)   Collection Time: 08/15/22  1:46 PM   Specimen: BLOOD  Result Value Ref Range Status   Specimen Description BLOOD BLOOD LEFT ARM  Final   Special Requests   Final    BOTTLES DRAWN AEROBIC AND ANAEROBIC Blood Culture adequate volume   Culture   Final    NO GROWTH 2 DAYS Performed at [facility], [address], [place], [place] [address]    Report Status PENDING  Incomplete  Blood culture (routine x 2)     Status: None (Preliminary result)   Collection Time: 08/15/22  1:46 PM    Specimen: BLOOD  Result Value Ref Range Status   Specimen Description BLOOD BLOOD RIGHT ARM  Final   Special Requests   Final    BOTTLES DRAWN AEROBIC AND ANAEROBIC Blood Culture adequate volume   Culture   Final    NO GROWTH 2 DAYS Performed at [facility], [address] [place], [place] [address]    Report Status PENDING  Incomplete         Radiology Studies: CT CHEST ABDOMEN PELVIS W CONTRAST  Result Date: 08/15/2022 CLINICAL DATA:  Sepsis.  History of COPD.  New onset dyspnea EXAM: CT CHEST, ABDOMEN, AND PELVIS WITH CONTRAST TECHNIQUE: Multidetector CT imaging of the chest, abdomen and pelvis was performed following the standard protocol during bolus administration of intravenous contrast. RADIATION DOSE REDUCTION: This exam was performed according to the departmental dose-optimization program which includes automated exposure control, adjustment of the mA and/or kV according to patient size and/or use of iterative reconstruction technique. CONTRAST:  75mL OMNIPAQUE IOHEXOL 300 MG/ML  SOLN COMPARISON:  Prior chest CT  scan 12/04/2018. Abdomen pelvis CT scan 01/09/2021. Other comparison exams. FINDINGS: CT CHEST FINDINGS Cardiovascular: Heart is nonenlarged. Trace pericardial fluid. Coronary artery calcifications are seen. The thoracic aorta has a normal course and caliber with scattered mild atherosclerotic calcified plaque. Mediastinum/Nodes: Normal caliber thoracic esophagus. Preserved thyroid gland. No specific abnormal lymph node enlargement present in the axillary region, right hilum. Right hilar node on series 2, image 35 measures 18 by 13 mm. There are some prominent mediastinal nodes. Example precarinal on series 2 image 30 measures 20 by 15 mm. This has increased in size from previous. There also is an enlarging subcarinal node measuring 3.6 by 1.3 cm on series 2, image 37. Lungs/Pleura: Advanced centrilobular emphysematous lung changes are identified, greatest in the upper  lung zones. Consolidative opacity seen along the left lower lobe with some interstitial components. Acute infiltrate is possible such as pneumonia. No significant effusion. No additional areas of consolidation. No pneumothorax. Musculoskeletal: Chronic left upper chest rib deformities. Healed fractures. CT ABDOMEN PELVIS FINDINGS Hepatobiliary: No focal liver abnormality is seen. No gallstones, gallbladder wall thickening, or biliary dilatation. Pancreas: Global pancreatic atrophy, unchanged from previous. Spleen: Normal in size without focal abnormality. Adrenals/Urinary Tract: Focal area of macroscopic fat in the left adrenal gland, unchanged from previous and consistent with a myelolipoma. The right adrenal gland is preserved. Once again there is enhancing mass along the medial aspect of the right kidney. Previously measuring 4.5 x 3.2 cm and today 4.3 by 3.2 cm consistent with known renal cell carcinoma. No separate new enhancing renal mass or collecting system dilatation. Preserved contours of the underdistended urinary bladder. Stomach/Bowel: On this non oral contrast exam the bowel is nondilated. There are some scattered stool. The stomach is underdistended with fluid. Vascular/Lymphatic: Diffuse vascular calcifications identified. There are areas of stenosis suggested along the iliac vessels bilaterally including the common iliac arteries. Please correlate with symptoms. 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 Enlarged left external iliac chain lymph node, unchanged from previous measuring 14 by 13 mm on series 2, image 124. Reproductive: Heterogeneous prostate Other: Mild anasarca.  No free air or free fluid.  Diffuse motion Musculoskeletal: Curvature and degenerative changes seen along the spine. IMPRESSION: Large area of consolidative opacity along the left lower lobe of the lung worrisome for pneumonia. Recommend  follow-up. Enlarged left hilar and mediastinal lymph nodes. These could be reactive but would recommend attention on follow-up after clearance of the pneumonia. Known right-sided renal cell carcinoma again identified. Stable enlarged left external iliac chain lymph node. No bowel obstruction, free air or free fluid. Diffuse motion. Electronically Signed   By: Karen Kays M.D.   On: 08/15/2022 14:59        Scheduled Meds:  aspirin EC  81 mg Oral Daily   atorvastatin  10 mg Oral Daily   azithromycin  500 mg Oral Daily   clopidogrel  75 mg Oral Daily   heparin  5,000 Units Subcutaneous Q8H   pantoprazole  80 mg Oral Daily   Continuous Infusions:  cefTRIAXone (ROCEPHIN)  IV Stopped (08/16/22 1353)   lactated ringers 75 mL/hr at 08/17/22 0700     LOS: 2 days    Tresa Moore, MD [facility]   If 7PM-7AM, please contact night-coverage  08/17/2022, 1:55 PM

## 2022-08-17 NOTE — Plan of Care (Signed)

## 2022-08-18 DIAGNOSIS — R6520 Severe sepsis without septic shock: Secondary | ICD-10-CM | POA: Diagnosis not present

## 2022-08-18 DIAGNOSIS — A419 Sepsis, unspecified organism: Secondary | ICD-10-CM | POA: Diagnosis not present

## 2022-08-18 LAB — BASIC METABOLIC PANEL
Anion gap: 7 (ref 5–15)
BUN: 16 mg/dL (ref 8–23)
CO2: 28 mmol/L (ref 22–32)
Calcium: 8.6 mg/dL — ABNORMAL LOW (ref 8.9–10.3)
Chloride: 102 mmol/L (ref 98–111)
Creatinine, Ser: 0.71 mg/dL (ref 0.61–1.24)
GFR, Estimated: >60 mL/min (ref >60)
Glucose, Bld: 85 mg/dL (ref 70–99)
Potassium: 3.5 mmol/L (ref 3.5–5.1)
Sodium: 137 mmol/L (ref 135–145)

## 2022-08-18 LAB — CBC WITH DIFFERENTIAL/PLATELET
Abs Immature Granulocytes: 0.07 10*3/uL (ref 0.00–0.07)
Basophils Absolute: 0 10*3/uL (ref 0.0–0.1)
Basophils Relative: 0 %
Eosinophils Absolute: 0.1 10*3/uL (ref 0.0–0.5)
Eosinophils Relative: 0 %
HCT: 38.8 % — ABNORMAL LOW (ref 39.0–52.0)
Hemoglobin: 13 g/dL (ref 13.0–17.0)
Immature Granulocytes: 1 %
Lymphocytes Relative: 7 %
Lymphs Abs: 0.8 10*3/uL (ref 0.7–4.0)
MCH: 31 pg (ref 26.0–34.0)
MCHC: 33.5 g/dL (ref 30.0–36.0)
MCV: 92.6 fL (ref 80.0–100.0)
Monocytes Absolute: 0.8 10*3/uL (ref 0.1–1.0)
Monocytes Relative: 7 %
Neutro Abs: 10.2 10*3/uL — ABNORMAL HIGH (ref 1.7–7.7)
Neutrophils Relative %: 85 %
Platelets: 271 10*3/uL (ref 150–400)
RBC: 4.19 MIL/uL — ABNORMAL LOW (ref 4.22–5.81)
RDW: 12.9 % (ref 11.5–15.5)
WBC: 12 10*3/uL — ABNORMAL HIGH (ref 4.0–10.5)
nRBC: 0 % (ref 0.0–0.2)

## 2022-08-18 LAB — CULTURE, BLOOD (ROUTINE X 2)

## 2022-08-18 MED ORDER — AMLODIPINE BESYLATE 5 MG PO TABS
5.0000 mg | ORAL_TABLET | Freq: Every day | ORAL | Status: DC
  Administered 2022-08-18 – 2022-08-19 (×2): 5 mg via ORAL
  Filled 2022-08-18 (×3): qty 1

## 2022-08-18 NOTE — Plan of Care (Signed)

## 2022-08-18 NOTE — Progress Notes (Signed)
PROGRESS NOTE    Derek Henderson  RUE:[id number] DOB: 1945-10-27 DOA: 08/15/2022 PCP: Danella Penton, MD    Brief Narrative:   77 year old male with history of COPD, hyperlipidemia, GERD, clear-cell carcinoma of the right kidney, PAD, malignant melanoma, pan lobar emphysema, who presents emergency department for chief concerns of shortness of breath, nausea, vomiting, diarrhea. Reports he started having worsening shortness of breath last night.  He states he has been having chronic shortness of breath for 6 years.   He denies known fever, new cough, chest pain, shortness of breath, abdominal pain, dysuria, hematuria, nausea, vomiting.  He denies diarrhea.  He denies weakness or change to appetite.   He reports he does not wear oxygen at baseline.   Assessment & Plan:   Principal Problem:   Severe sepsis (HCC) Active Problems:   History of malignant melanoma   Panlobular emphysema (HCC)   Tobacco abuse   Peripheral artery disease (HCC)   Atherosclerosis of native arteries of extremity with intermittent claudication (HCC)   Benign essential hypertension   AKI (acute kidney injury) (HCC)   Diarrhea  * Severe sepsis (HCC) Secondary to community-acquired pneumonia Status post sodium chloride 1 L bolus, sodium chloride 250 mL bolus per EDP Ordered additional sodium chloride 1 L bolus on admission Plan: Continue with Rocephin/azithromycin Continue bronchodilators DC IVF Trend labs Monitor vitals and fever curve  Diarrhea With markedly elevated leukocytosis Diarrhea has resolved.  Low suspicion for infectious diarrhea at this time   AKI (acute kidney injury) (HCC) Presumed secondary to severe sepsis, no CKD at baseline Treat per sepsis Creatinine improving DC IVF 5/4 Continue daily labs   Benign essential hypertension Blood pressure remains elevated.  Will initiate amlodipine 5 mg daily on 5/5.  Continue as needed IV hydralazine   Tobacco abuse As needed nicotine  patch ordered for nicotine craving   DVT prophylaxis: SQ heparin Code Status: Full Family Communication: Wife and granddaughter at bedside 5/3 Disposition Plan: Status is: Inpatient Remains inpatient appropriate because: Severe community-acquired pneumonia on IV antibiotics   Level of care: Progressive  Consultants:  None  Procedures:  None  Antimicrobials: Ceftriaxone Azithromycin   Subjective: Patient seen and examined.  Resting comfortably in bed.  In good spirits.  Feeling better.  Objective: Vitals:   08/18/22 0106 08/18/22 0520 08/18/22 0807 08/18/22 1213  BP:  (!) 165/71 (!) 151/74 (!) 176/76  Pulse: 78 72 76 83  Resp:  19 16 16   Temp:  99 F (37.2 C) 98.3 F (36.8 C) 98.4 F (36.9 C)  TempSrc:   Oral Oral  SpO2: 93% 94% 93% 94%    Intake/Output Summary (Last 24 hours) at 08/18/2022 1346 Last data filed at 08/18/2022 1000 Gross per 24 hour  Intake 1244.66 ml  Output 2960 ml  Net -1715.34 ml   There were no vitals filed for this visit.  Examination:  General exam: No acute distress Respiratory system: Rhonchi on left.  Normal work of breathing.  3 L Cardiovascular system: S1-S2, RRR, no murmurs, no pedal edema Gastrointestinal system: Thin,, NT/ND, normal bowel sounds Central nervous system: Alert and oriented. No focal neurological deficits. Extremities: Symmetric 5 x 5 power. Skin: No rashes, lesions or ulcers Psychiatry: Judgement and insight appear normal. Mood & affect appropriate.     Data Reviewed: I have personally reviewed following labs and imaging studies  CBC: Recent Labs  Lab 08/15/22 1252 08/16/22 0635 08/17/22 0804 08/18/22 0528  WBC 22.6* 18.7* 16.6* 12.0*  NEUTROABS  --   --  15.0* 10.2*  HGB 18.6* 14.8 12.8* 13.0  HCT 54.9* 43.8 38.1* 38.8*  MCV 93.5 92.4 93.8 92.6  PLT 325 232 247 271   Basic Metabolic Panel: Recent Labs  Lab 08/15/22 1252 08/16/22 0635 08/17/22 0804 08/18/22 0528  NA 134* 133* 133* 137  K 5.1  4.0 3.9 3.5  CL 97* 101 101 102  CO2 21* 24 25 28   GLUCOSE 102* 100* 81 85  BUN 32* 28* 15 16  CREATININE 2.24* 1.05 0.83 0.71  CALCIUM 9.9 8.6* 8.9 8.6*   GFR: CrCl cannot be calculated (Unknown ideal weight.). Liver Function Tests: Recent Labs  Lab 08/15/22 1252  AST 28  ALT 14  ALKPHOS 61  BILITOT 1.7*  PROT 7.2  ALBUMIN 3.8   Recent Labs  Lab 08/15/22 1252  LIPASE 21   No results for input(s): "AMMONIA" in the last 168 hours. Coagulation Profile: Recent Labs  Lab 08/16/22 0635  INR 1.5*   Cardiac Enzymes: No results for input(s): "CKTOTAL", "CKMB", "CKMBINDEX", "TROPONINI" in the last 168 hours. BNP (last 3 results) No results for input(s): "PROBNP" in the last 8760 hours. HbA1C: No results for input(s): "HGBA1C" in the last 72 hours. CBG: No results for input(s): "GLUCAP" in the last 168 hours. Lipid Profile: No results for input(s): "CHOL", "HDL", "LDLCALC", "TRIG", "CHOLHDL", "LDLDIRECT" in the last 72 hours. Thyroid Function Tests: No results for input(s): "TSH", "T4TOTAL", "FREET4", "T3FREE", "THYROIDAB" in the last 72 hours. Anemia Panel: No results for input(s): "VITAMINB12", "FOLATE", "FERRITIN", "TIBC", "IRON", "RETICCTPCT" in the last 72 hours. Sepsis Labs: Recent Labs  Lab 08/15/22 1346 08/15/22 1514 08/16/22 0635 08/16/22 0804  PROCALCITON  --  40.79 28.66  --   LATICACIDVEN 4.5* 3.8*  --  1.8    Recent Results (from the past 240 hour(s))  Blood culture (routine x 2)     Status: None (Preliminary result)   Collection Time: 08/15/22  1:46 PM   Specimen: BLOOD  Result Value Ref Range Status   Specimen Description BLOOD BLOOD LEFT ARM  Final   Special Requests   Final    BOTTLES DRAWN AEROBIC AND ANAEROBIC Blood Culture adequate volume   Culture   Final    NO GROWTH 3 DAYS Performed at [facility], [address] [place], [place] [address]    Report Status PENDING  Incomplete  Blood culture (routine x 2)     Status: None  (Preliminary result)   Collection Time: 08/15/22  1:46 PM   Specimen: BLOOD  Result Value Ref Range Status   Specimen Description BLOOD BLOOD RIGHT ARM  Final   Special Requests   Final    BOTTLES DRAWN AEROBIC AND ANAEROBIC Blood Culture adequate volume   Culture   Final    NO GROWTH 3 DAYS Performed at [facility], [address] [place], [place] [address]    Report Status PENDING  Incomplete         Radiology Studies: No results found.      Scheduled Meds:  amLODipine  5 mg Oral Daily   aspirin EC  81 mg Oral Daily   atorvastatin  10 mg Oral Daily   azithromycin  500 mg Oral Daily   clopidogrel  75 mg Oral Daily   heparin  5,000 Units Subcutaneous Q8H   pantoprazole  80 mg Oral Daily   Continuous Infusions:  cefTRIAXone (ROCEPHIN)  IV 2 g (08/17/22 1539)     LOS: 3 days    Tresa Moore,  MD [facility]   If 7PM-7AM, please contact night-coverage  08/18/2022, 1:46 PM

## 2022-08-19 DIAGNOSIS — A419 Sepsis, unspecified organism: Secondary | ICD-10-CM | POA: Diagnosis not present

## 2022-08-19 DIAGNOSIS — R6520 Severe sepsis without septic shock: Secondary | ICD-10-CM | POA: Diagnosis not present

## 2022-08-19 LAB — BASIC METABOLIC PANEL
Anion gap: 9 (ref 5–15)
BUN: 12 mg/dL (ref 8–23)
CO2: 29 mmol/L (ref 22–32)
Calcium: 9.2 mg/dL (ref 8.9–10.3)
Chloride: 98 mmol/L (ref 98–111)
Creatinine, Ser: 0.58 mg/dL — ABNORMAL LOW (ref 0.61–1.24)
GFR, Estimated: >60 mL/min (ref >60)
Glucose, Bld: 94 mg/dL (ref 70–99)
Potassium: 3.5 mmol/L (ref 3.5–5.1)
Sodium: 136 mmol/L (ref 135–145)

## 2022-08-19 LAB — CBC WITH DIFFERENTIAL/PLATELET
Abs Immature Granulocytes: 0.14 10*3/uL — ABNORMAL HIGH (ref 0.00–0.07)
Basophils Absolute: 0 10*3/uL (ref 0.0–0.1)
Basophils Relative: 0 %
Eosinophils Absolute: 0 10*3/uL (ref 0.0–0.5)
Eosinophils Relative: 0 %
HCT: 44.5 % (ref 39.0–52.0)
Hemoglobin: 14.7 g/dL (ref 13.0–17.0)
Immature Granulocytes: 1 %
Lymphocytes Relative: 8 %
Lymphs Abs: 0.8 10*3/uL (ref 0.7–4.0)
MCH: 30.6 pg (ref 26.0–34.0)
MCHC: 33 g/dL (ref 30.0–36.0)
MCV: 92.5 fL (ref 80.0–100.0)
Monocytes Absolute: 1.1 10*3/uL — ABNORMAL HIGH (ref 0.1–1.0)
Monocytes Relative: 11 %
Neutro Abs: 7.7 10*3/uL (ref 1.7–7.7)
Neutrophils Relative %: 80 %
Platelets: 332 10*3/uL (ref 150–400)
RBC: 4.81 MIL/uL (ref 4.22–5.81)
RDW: 12.8 % (ref 11.5–15.5)
WBC: 9.8 10*3/uL (ref 4.0–10.5)
nRBC: 0 % (ref 0.0–0.2)

## 2022-08-19 LAB — CULTURE, BLOOD (ROUTINE X 2): Special Requests: ADEQUATE

## 2022-08-19 MED ORDER — SODIUM CHLORIDE 0.9 % IV SOLN
2.0000 g | INTRAVENOUS | Status: DC
  Filled 2022-08-19: qty 20

## 2022-08-19 MED ORDER — METHOCARBAMOL 500 MG PO TABS: 500.0000 mg | ORAL_TABLET | Freq: Four times a day (QID) | ORAL | Status: DC | PRN

## 2022-08-19 MED ORDER — IPRATROPIUM-ALBUTEROL 0.5-2.5 (3) MG/3ML IN SOLN
3.0000 mL | Freq: Four times a day (QID) | RESPIRATORY_TRACT | Status: DC
  Administered 2022-08-19 – 2022-08-20 (×4): 3 mL via RESPIRATORY_TRACT
  Filled 2022-08-19 (×4): qty 3

## 2022-08-19 MED ORDER — METHYLPREDNISOLONE SODIUM SUCC 40 MG IJ SOLR
40.0000 mg | Freq: Every day | INTRAMUSCULAR | Status: DC
  Administered 2022-08-19 – 2022-08-20 (×2): 40 mg via INTRAVENOUS
  Filled 2022-08-19 (×2): qty 1

## 2022-08-19 NOTE — Progress Notes (Signed)
Tried to wean oxygen for patient.  Patient's saturation at rest on 3 litres of oxygen = 90% Patient's saturation while ambulating on 3 litres of oxygen = 84%.  MD made aware of the results.

## 2022-08-19 NOTE — Progress Notes (Signed)
PROGRESS NOTE    Derek Henderson  WUJ:[id number] DOB: January 01, 1946 DOA: 08/15/2022 PCP: Danella Penton, MD    Brief Narrative:   77 year old male with history of COPD, hyperlipidemia, GERD, clear-cell carcinoma of the right kidney, PAD, malignant melanoma, pan lobar emphysema, who presents emergency department for chief concerns of shortness of breath, nausea, vomiting, diarrhea. Reports he started having worsening shortness of breath last night.  He states he has been having chronic shortness of breath for 6 years.   He denies known fever, new cough, chest pain, shortness of breath, abdominal pain, dysuria, hematuria, nausea, vomiting.  He denies diarrhea.  He denies weakness or change to appetite.   He reports he does not wear oxygen at baseline.   Assessment & Plan:   Principal Problem:   Severe sepsis (HCC) Active Problems:   History of malignant melanoma   Panlobular emphysema (HCC)   Tobacco abuse   Peripheral artery disease (HCC)   Atherosclerosis of native arteries of extremity with intermittent claudication (HCC)   Benign essential hypertension   AKI (acute kidney injury) (HCC)   Diarrhea  Severe sepsis (HCC) Secondary to community-acquired pneumonia Acute hypoxic respiratory failure secondary to above Status post sodium chloride 1 L bolus, sodium chloride 250 mL bolus per EDP Ordered additional sodium chloride 1 L bolus on admission Patient has completed 5-day course of Rocephin and 3-day course of azithromycin.  Saturations are improving however he remains markedly hypoxic with minimal exertion. Plan: Continue with Rocephin/azithromycin Scheduled bronchodilators DuoNeb every 6 hours Solu-Medrol 40 mg IV daily Wean oxygen as tolerated  Diarrhea With markedly elevated leukocytosis Diarrhea has resolved.  Low suspicion for infectious diarrhea at this time   AKI (acute kidney injury) (HCC) Presumed secondary to severe sepsis, no CKD at baseline Treat per  sepsis Creatinine improving No need for IVF   Benign essential hypertension Blood pressure remains elevated.  Continue amlodipine 5 mg daily   Tobacco abuse As needed nicotine patch ordered for nicotine craving   DVT prophylaxis: SQ heparin Code Status: Full Family Communication: Wife and granddaughter at bedside 5/3, wife bedside 5/6 Disposition Plan: Status is: Inpatient Remains inpatient appropriate because: Severe community-acquired pneumonia on IV antibiotics.  Hypoxic respiratory failure.   Level of care: Progressive  Consultants:  None  Procedures:  None  Antimicrobials: Ceftriaxone Azithromycin   Subjective: Patient seen and examined.  Remains on nasal cannula.  Feeling better.  Objective: Vitals:   08/19/22 0415 08/19/22 0803 08/19/22 1052 08/19/22 1133  BP: (!) 151/74 (!) 176/79  (!) 146/62  Pulse: 75 93  86  Resp: 18 18  16   Temp: 98 F (36.7 C) 98.4 F (36.9 C)  98.2 F (36.8 C)  TempSrc: Oral Oral    SpO2: 95% 90% 96% 92%    Intake/Output Summary (Last 24 hours) at 08/19/2022 1506 Last data filed at 08/19/2022 1501 Gross per 24 hour  Intake 1000 ml  Output 2175 ml  Net -1175 ml   There were no vitals filed for this visit.  Examination:  General exam: NAD Respiratory system: Left-sided rales.  Mild end expiratory wheeze.  Normal work of breathing.  3 L Cardiovascular system: S1-S2, RRR, no murmurs, no pedal edema Gastrointestinal system: Thin,, NT/ND, normal bowel sounds Central nervous system: Alert and oriented. No focal neurological deficits. Extremities: Symmetric 5 x 5 power. Skin: No rashes, lesions or ulcers Psychiatry: Judgement and insight appear normal. Mood & affect appropriate.     Data Reviewed: I have personally reviewed following  labs and imaging studies  CBC: Recent Labs  Lab 08/15/22 1252 08/16/22 0635 08/17/22 0804 08/18/22 0528 08/19/22 0914  WBC 22.6* 18.7* 16.6* 12.0* 9.8  NEUTROABS  --   --  15.0* 10.2* 7.7   HGB 18.6* 14.8 12.8* 13.0 14.7  HCT 54.9* 43.8 38.1* 38.8* 44.5  MCV 93.5 92.4 93.8 92.6 92.5  PLT 325 232 247 271 332   Basic Metabolic Panel: Recent Labs  Lab 08/15/22 1252 08/16/22 0635 08/17/22 0804 08/18/22 0528 08/19/22 0914  NA 134* 133* 133* 137 136  K 5.1 4.0 3.9 3.5 3.5  CL 97* 101 101 102 98  CO2 21* 24 25 28 29   GLUCOSE 102* 100* 81 85 94  BUN 32* 28* 15 16 12   CREATININE 2.24* 1.05 0.83 0.71 0.58*  CALCIUM 9.9 8.6* 8.9 8.6* 9.2   GFR: CrCl cannot be calculated (Unknown ideal weight.). Liver Function Tests: Recent Labs  Lab 08/15/22 1252  AST 28  ALT 14  ALKPHOS 61  BILITOT 1.7*  PROT 7.2  ALBUMIN 3.8   Recent Labs  Lab 08/15/22 1252  LIPASE 21   No results for input(s): "AMMONIA" in the last 168 hours. Coagulation Profile: Recent Labs  Lab 08/16/22 0635  INR 1.5*   Cardiac Enzymes: No results for input(s): "CKTOTAL", "CKMB", "CKMBINDEX", "TROPONINI" in the last 168 hours. BNP (last 3 results) No results for input(s): "PROBNP" in the last 8760 hours. HbA1C: No results for input(s): "HGBA1C" in the last 72 hours. CBG: No results for input(s): "GLUCAP" in the last 168 hours. Lipid Profile: No results for input(s): "CHOL", "HDL", "LDLCALC", "TRIG", "CHOLHDL", "LDLDIRECT" in the last 72 hours. Thyroid Function Tests: No results for input(s): "TSH", "T4TOTAL", "FREET4", "T3FREE", "THYROIDAB" in the last 72 hours. Anemia Panel: No results for input(s): "VITAMINB12", "FOLATE", "FERRITIN", "TIBC", "IRON", "RETICCTPCT" in the last 72 hours. Sepsis Labs: Recent Labs  Lab 08/15/22 1346 08/15/22 1514 08/16/22 0635 08/16/22 0804  PROCALCITON  --  40.79 28.66  --   LATICACIDVEN 4.5* 3.8*  --  1.8    Recent Results (from the past 240 hour(s))  Blood culture (routine x 2)     Status: None (Preliminary result)   Collection Time: 08/15/22  1:46 PM   Specimen: BLOOD  Result Value Ref Range Status   Specimen Description BLOOD BLOOD LEFT ARM   Final   Special Requests   Final    BOTTLES DRAWN AEROBIC AND ANAEROBIC Blood Culture adequate volume   Culture   Final    NO GROWTH 4 DAYS Performed at [facility], [address] [place], [place] [address]    Report Status PENDING  Incomplete  Blood culture (routine x 2)     Status: None (Preliminary result)   Collection Time: 08/15/22  1:46 PM   Specimen: BLOOD  Result Value Ref Range Status   Specimen Description BLOOD BLOOD RIGHT ARM  Final   Special Requests   Final    BOTTLES DRAWN AEROBIC AND ANAEROBIC Blood Culture adequate volume   Culture   Final    NO GROWTH 4 DAYS Performed at [facility], [address], [place], [place] [address]    Report Status PENDING  Incomplete         Radiology Studies: No results found.      Scheduled Meds:  amLODipine  5 mg Oral Daily   aspirin EC  81 mg Oral Daily   atorvastatin  10 mg Oral Daily   clopidogrel  75 mg Oral Daily  heparin  5,000 Units Subcutaneous Q8H   ipratropium-albuterol  3 mL Nebulization Q6H   methylPREDNISolone (SOLU-MEDROL) injection  40 mg Intravenous Daily   pantoprazole  80 mg Oral Daily   Continuous Infusions:     LOS: 4 days    Tresa Moore, MD [facility]   If 7PM-7AM, please contact night-coverage  08/19/2022, 3:06 PM

## 2022-08-19 NOTE — Care Management Important Message (Signed)
Important Message  Patient Details  Name: Derek Henderson MRN: [id number] Date of Birth: 18-Aug-1945   Medicare Important Message Given:  Yes     [name] 08/19/2022, 12:02 PM

## 2022-08-20 ENCOUNTER — Other Ambulatory Visit: Payer: Self-pay

## 2022-08-20 DIAGNOSIS — R6520 Severe sepsis without septic shock: Secondary | ICD-10-CM | POA: Diagnosis not present

## 2022-08-20 DIAGNOSIS — A419 Sepsis, unspecified organism: Secondary | ICD-10-CM | POA: Diagnosis not present

## 2022-08-20 LAB — CULTURE, BLOOD (ROUTINE X 2): Special Requests: ADEQUATE

## 2022-08-20 LAB — BASIC METABOLIC PANEL
Anion gap: 10 (ref 5–15)
BUN: 16 mg/dL (ref 8–23)
CO2: 30 mmol/L (ref 22–32)
Calcium: 9.1 mg/dL (ref 8.9–10.3)
Chloride: 96 mmol/L — ABNORMAL LOW (ref 98–111)
Creatinine, Ser: 0.62 mg/dL (ref 0.61–1.24)
GFR, Estimated: >60 mL/min (ref >60)
Glucose, Bld: 151 mg/dL — ABNORMAL HIGH (ref 70–99)
Potassium: 3.8 mmol/L (ref 3.5–5.1)
Sodium: 136 mmol/L (ref 135–145)

## 2022-08-20 LAB — CBC WITH DIFFERENTIAL/PLATELET
Abs Immature Granulocytes: 0.13 K/uL — ABNORMAL HIGH (ref 0.00–0.07)
Basophils Absolute: 0 K/uL (ref 0.0–0.1)
Basophils Relative: 0 %
Eosinophils Absolute: 0 K/uL (ref 0.0–0.5)
Eosinophils Relative: 0 %
HCT: 41.2 % (ref 39.0–52.0)
Hemoglobin: 14.1 g/dL (ref 13.0–17.0)
Immature Granulocytes: 2 %
Lymphocytes Relative: 12 %
Lymphs Abs: 0.8 K/uL (ref 0.7–4.0)
MCH: 31.5 pg (ref 26.0–34.0)
MCHC: 34.2 g/dL (ref 30.0–36.0)
MCV: 92 fL (ref 80.0–100.0)
Monocytes Absolute: 0.7 K/uL (ref 0.1–1.0)
Monocytes Relative: 11 %
Neutro Abs: 4.9 K/uL (ref 1.7–7.7)
Neutrophils Relative %: 75 %
Platelets: 354 K/uL (ref 150–400)
RBC: 4.48 MIL/uL (ref 4.22–5.81)
RDW: 12.8 % (ref 11.5–15.5)
WBC: 6.6 K/uL (ref 4.0–10.5)
nRBC: 0 % (ref 0.0–0.2)

## 2022-08-20 MED ORDER — ATORVASTATIN CALCIUM 10 MG PO TABS: 10.0000 mg | ORAL_TABLET | Freq: Every day | ORAL | 11 refills | Status: DC

## 2022-08-20 MED ORDER — ALBUTEROL SULFATE HFA 108 (90 BASE) MCG/ACT IN AERS: 2.0000 | INHALATION_SPRAY | Freq: Four times a day (QID) | RESPIRATORY_TRACT | 0 refills | Status: AC | PRN

## 2022-08-20 MED ORDER — PREDNISONE 20 MG PO TABS: 40.0000 mg | ORAL_TABLET | Freq: Every day | ORAL | 0 refills | Status: AC

## 2022-08-20 MED ORDER — BENZONATATE 100 MG PO CAPS: 200.0000 mg | ORAL_CAPSULE | Freq: Three times a day (TID) | ORAL | 0 refills | Status: AC | PRN

## 2022-08-20 NOTE — Progress Notes (Signed)
Patient alert and oriented, vss, no complaints of pain.  Escorted out of hospital via wheelchair by volunteers.   

## 2022-08-20 NOTE — Discharge Summary (Addendum)
Physician Discharge Summary  Derek Henderson ZOX:[id number] DOB: 29-Dec-1945 DOA: 08/15/2022  PCP: Danella Penton, MD  Admit date: 08/15/2022 Discharge date: 08/20/2022  Admitted From: Home Disposition:  Home  Recommendations for Outpatient Follow-up:  Follow up with PCP in 1-2 weeks   Home Health:No Equipment/Devices:Oxygen 3L via [place]   Discharge Condition:Stable  CODE STATUS:FULL  Diet recommendation: Reg  Brief/Interim Summary:  77 year old male with history of COPD, hyperlipidemia, GERD, clear-cell carcinoma of the right kidney, PAD, malignant melanoma, pan lobar emphysema, who presents emergency department for chief concerns of shortness of breath, nausea, vomiting, diarrhea. Reports he started having worsening shortness of breath last night.  He states he has been having chronic shortness of breath for 6 years.   He denies known fever, new cough, chest pain, shortness of breath, abdominal pain, dysuria, hematuria, nausea, vomiting.  He denies diarrhea.  He denies weakness or change to appetite.   He reports he does not wear oxygen at baseline.  To have significant left-sided pneumonia.  Status post 6 doses of intravenous antibiotic.  Clinical improvement and patient feeling back to baseline however remains hypoxic requiring 3 L nasal cannula.  Home DME oxygen will be arranged.  Outpatient follow-up with PCP.  Referral to pulmonary nodule clinic.   Discharge Diagnoses:  Principal Problem:   Severe sepsis (HCC) Active Problems:   History of malignant melanoma   Panlobular emphysema (HCC)   Tobacco abuse   Peripheral artery disease (HCC)   Atherosclerosis of native arteries of extremity with intermittent claudication (HCC)   Benign essential hypertension   AKI (acute kidney injury) (HCC)   Diarrhea  Severe sepsis (HCC) Secondary to community-acquired pneumonia Acute hypoxic respiratory failure secondary to above Status post sodium chloride 1 L bolus, sodium chloride 250 mL  bolus per EDP Ordered additional sodium chloride 1 L bolus on admission Patient has completed 6-day course of Rocephin and 3-day course of azithromycin.  Saturations are improving however he remains hypoxic with exertion. Plan: No indication for antibiotics at time of discharge.  Will recommend short course of p.o. prednisone.  Home DME's oxygen ordered.  3 L.  Follow-up outpatient PCP.  Referral to pulmonary nodule clinic.   Diarrhea Diarrhea has resolved.  Low suspicion for infectious diarrhea at this time   AKI (acute kidney injury) (HCC) Presumed secondary to severe sepsis, no CKD at baseline Treat per sepsis Creatinine improving At baseline at time of discharge  Discharge Instructions  Discharge Instructions     Ambulatory Referral for Lung Cancer Scre   Complete by: As directed    Diet - low sodium heart healthy   Complete by: As directed    Increase activity slowly   Complete by: As directed       Allergies as of 08/20/2022       Reactions   Codeine Other (See Comments)   Headache        Medication List     TAKE these medications    acetaminophen 325 MG tablet Commonly known as: TYLENOL Take 2 tablets (650 mg total) by mouth every 6 (six) hours as needed for mild pain.   albuterol 108 (90 Base) MCG/ACT inhaler Commonly known as: Ventolin HFA Inhale 1-2 puffs into the lungs every 6 (six) hours as needed for shortness of breath (use when you have trouble breathing.). What changed: Another medication with the same name was added. Make sure you understand how and when to take each.   albuterol 108 (90 Base) MCG/ACT inhaler Commonly  known as: VENTOLIN HFA Inhale 2 puffs into the lungs every 6 (six) hours as needed for wheezing or shortness of breath. What changed: You were already taking a medication with the same name, and this prescription was added. Make sure you understand how and when to take each.   aspirin EC 81 MG tablet Take 1 tablet (81 mg total) by  mouth daily.   atorvastatin 10 MG tablet Commonly known as: Lipitor Take 1 tablet (10 mg total) by mouth daily.   benzonatate 100 MG capsule Commonly known as: Tessalon Perles Take 2 capsules (200 mg total) by mouth 3 (three) times daily as needed for cough.   clopidogrel 75 MG tablet Commonly known as: PLAVIX Take 1 tablet (75 mg total) by mouth daily.   omeprazole 40 MG capsule Commonly known as: PRILOSEC Take 40 mg by mouth daily.   predniSONE 20 MG tablet Commonly known as: DELTASONE Take 2 tablets (40 mg total) by mouth daily for 5 days.   VITAMIN D3 PO Take 1 tablet by mouth daily.               Durable Medical Equipment  (From admission, onward)           Start     Ordered   08/20/22 1400  For home use only DME oxygen  Once       Question Answer Comment  Length of Need 6 Months   Mode or (Route) Nasal cannula   Liters per Minute 3   Frequency Continuous (stationary and portable oxygen unit needed)   Oxygen conserving device Yes   Oxygen delivery system Gas      08/20/22 1359            Allergies  Allergen Reactions   Codeine Other (See Comments)    Headache     Consultations: None   Procedures/Studies: CT CHEST ABDOMEN PELVIS W CONTRAST  Result Date: 08/15/2022 CLINICAL DATA:  Sepsis.  History of COPD.  New onset dyspnea EXAM: CT CHEST, ABDOMEN, AND PELVIS WITH CONTRAST TECHNIQUE: Multidetector CT imaging of the chest, abdomen and pelvis was performed following the standard protocol during bolus administration of intravenous contrast. RADIATION DOSE REDUCTION: This exam was performed according to the departmental dose-optimization program which includes automated exposure control, adjustment of the mA and/or kV according to patient size and/or use of iterative reconstruction technique. CONTRAST:  75mL OMNIPAQUE IOHEXOL 300 MG/ML  SOLN COMPARISON:  Prior chest CT scan 12/04/2018. Abdomen pelvis CT scan 01/09/2021. Other comparison exams.  FINDINGS: CT CHEST FINDINGS Cardiovascular: Heart is nonenlarged. Trace pericardial fluid. Coronary artery calcifications are seen. The thoracic aorta has a normal course and caliber with scattered mild atherosclerotic calcified plaque. Mediastinum/Nodes: Normal caliber thoracic esophagus. Preserved thyroid gland. No specific abnormal lymph node enlargement present in the axillary region, right hilum. Right hilar node on series 2, image 35 measures 18 by 13 mm. There are some prominent mediastinal nodes. Example precarinal on series 2 image 30 measures 20 by 15 mm. This has increased in size from previous. There also is an enlarging subcarinal node measuring 3.6 by 1.3 cm on series 2, image 37. Lungs/Pleura: Advanced centrilobular emphysematous lung changes are identified, greatest in the upper lung zones. Consolidative opacity seen along the left lower lobe with some interstitial components. Acute infiltrate is possible such as pneumonia. No significant effusion. No additional areas of consolidation. No pneumothorax. Musculoskeletal: Chronic left upper chest rib deformities. Healed fractures. CT ABDOMEN PELVIS FINDINGS Hepatobiliary: No focal liver abnormality  is seen. No gallstones, gallbladder wall thickening, or biliary dilatation. Pancreas: Global pancreatic atrophy, unchanged from previous. Spleen: Normal in size without focal abnormality. Adrenals/Urinary Tract: Focal area of macroscopic fat in the left adrenal gland, unchanged from previous and consistent with a myelolipoma. The right adrenal gland is preserved. Once again there is enhancing mass along the medial aspect of the right kidney. Previously measuring 4.5 x 3.2 cm and today 4.3 by 3.2 cm consistent with known renal cell carcinoma. No separate new enhancing renal mass or collecting system dilatation. Preserved contours of the underdistended urinary bladder. Stomach/Bowel: On this non oral contrast exam the bowel is nondilated. There are some  scattered stool. The stomach is underdistended with fluid. Vascular/Lymphatic: Diffuse vascular calcifications identified. There are areas of stenosis suggested along the iliac vessels bilaterally including the common iliac arteries. Please correlate with symptoms. 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 Enlarged left external iliac chain lymph node, unchanged from previous measuring 14 by 13 mm on series 2, image 124. Reproductive: Heterogeneous prostate Other: Mild anasarca.  No free air or free fluid.  Diffuse motion Musculoskeletal: Curvature and degenerative changes seen along the spine. IMPRESSION: Large area of consolidative opacity along the left lower lobe of the lung worrisome for pneumonia. Recommend follow-up. Enlarged left hilar and mediastinal lymph nodes. These could be reactive but would recommend attention on follow-up after clearance of the pneumonia. Known right-sided renal cell carcinoma again identified. Stable enlarged left external iliac chain lymph node. No bowel obstruction, free air or free fluid. Diffuse motion. Electronically Signed   By: Karen Kays M.D.   On: 08/15/2022 14:59   DG Chest 2 View  Result Date: 08/15/2022 CLINICAL DATA:  Shortness of breath EXAM: CHEST - 2 VIEW COMPARISON:  None Available. FINDINGS: No pleural effusion. No pneumothorax. There is a large area of reticulonodular airspace opacity in the left mid and lower lung fields, suspicious for infection. Normal cardiac and mediastinal contours. No radiographically apparent displaced rib fractures. Visualized upper abdomen is unremarkable. IMPRESSION: Large area of reticulonodular airspace opacity in the left mid and lower lung fields, suspicious for infection. Recommend further evaluation with a chest CT for more definitive characterization. Electronically Signed   By: Lorenza Cambridge M.D.   On: 08/15/2022 13:35       Subjective: Seen and examined on day of discharge.  Stable no distress.  Appropriate for discharge home.  Discharge Exam: Vitals:   08/20/22 1237 08/20/22 1331  BP: (!) 134/58   Pulse: 67   Resp: 18   Temp: (!) 97.4 F (36.3 C)   SpO2: (!) 88% (!) 85%   Vitals:   08/20/22 0850 08/20/22 0929 08/20/22 1237 08/20/22 1331  BP: (!) 94/58  (!) 134/58   Pulse: 72  67   Resp: 16  18   Temp: 98 F (36.7 C)  (!) 97.4 F (36.3 C)   TempSrc: Oral  Oral   SpO2: 95%  (!) 88% (!) 85%  Weight:      Height:  5\' 8"  (1.727 m)      General: Pt is alert, awake, not in acute distress Cardiovascular: RRR, S1/S2 +, no rubs, no gallops Respiratory: CTA bilaterally, no wheezing, no rhonchi Abdominal: Soft, NT, ND, bowel sounds + Extremities: no edema, no cyanosis    The results of significant diagnostics from this hospitalization (including imaging, microbiology, ancillary and laboratory) are listed below for reference.  Microbiology: Recent Results (from the past 240 hour(s))  Blood culture (routine x 2)     Status: None   Collection Time: 08/15/22  1:46 PM   Specimen: BLOOD  Result Value Ref Range Status   Specimen Description BLOOD BLOOD LEFT ARM  Final   Special Requests   Final    BOTTLES DRAWN AEROBIC AND ANAEROBIC Blood Culture adequate volume   Culture   Final    NO GROWTH 5 DAYS Performed at [facility], [address] [place], [place] [address]    Report Status 08/20/2022 FINAL  Final  Blood culture (routine x 2)     Status: None   Collection Time: 08/15/22  1:46 PM   Specimen: BLOOD  Result Value Ref Range Status   Specimen Description BLOOD BLOOD RIGHT ARM  Final   Special Requests   Final    BOTTLES DRAWN AEROBIC AND ANAEROBIC Blood Culture adequate volume   Culture   Final    NO GROWTH 5 DAYS Performed at [facility], [address] [place], [place] [address]    Report Status 08/20/2022 FINAL  Final     Labs: BNP (last 3  results) No results for input(s): "BNP" in the last 8760 hours. Basic Metabolic Panel: Recent Labs  Lab 08/16/22 0635 08/17/22 0804 08/18/22 0528 08/19/22 0914 08/20/22 0757  NA 133* 133* 137 136 136  K 4.0 3.9 3.5 3.5 3.8  CL 101 101 102 98 96*  CO2 24 25 28 29 30   GLUCOSE 100* 81 85 94 151*  BUN 28* 15 16 12 16   CREATININE 1.05 0.83 0.71 0.58* 0.62  CALCIUM 8.6* 8.9 8.6* 9.2 9.1   Liver Function Tests: Recent Labs  Lab 08/15/22 1252  AST 28  ALT 14  ALKPHOS 61  BILITOT 1.7*  PROT 7.2  ALBUMIN 3.8   Recent Labs  Lab 08/15/22 1252  LIPASE 21   No results for input(s): "AMMONIA" in the last 168 hours. CBC: Recent Labs  Lab 08/16/22 0635 08/17/22 0804 08/18/22 0528 08/19/22 0914 08/20/22 0757  WBC 18.7* 16.6* 12.0* 9.8 6.6  NEUTROABS  --  15.0* 10.2* 7.7 4.9  HGB 14.8 12.8* 13.0 14.7 14.1  HCT 43.8 38.1* 38.8* 44.5 41.2  MCV 92.4 93.8 92.6 92.5 92.0  PLT 232 247 271 332 354   Cardiac Enzymes: No results for input(s): "CKTOTAL", "CKMB", "CKMBINDEX", "TROPONINI" in the last 168 hours. BNP: Invalid input(s): "POCBNP" CBG: No results for input(s): "GLUCAP" in the last 168 hours. D-Dimer No results for input(s): "DDIMER" in the last 72 hours. Hgb A1c No results for input(s): "HGBA1C" in the last 72 hours. Lipid Profile No results for input(s): "CHOL", "HDL", "LDLCALC", "TRIG", "CHOLHDL", "LDLDIRECT" in the last 72 hours. Thyroid function studies No results for input(s): "TSH", "T4TOTAL", "T3FREE", "THYROIDAB" in the last 72 hours.  Invalid input(s): "FREET3" Anemia work up No results for input(s): "VITAMINB12", "FOLATE", "FERRITIN", "TIBC", "IRON", "RETICCTPCT" in the last 72 hours. Urinalysis    Component Value Date/Time   COLORURINE AMBER (A) 08/15/2022 1347   APPEARANCEUR CLOUDY (A) 08/15/2022 1347   LABSPEC 1.030 08/15/2022 1347   PHURINE 5.0 08/15/2022 1347   GLUCOSEU 50 (A) 08/15/2022 1347   HGBUR SMALL (A) 08/15/2022 1347   BILIRUBINUR  SMALL (A) 08/15/2022 1347   KETONESUR NEGATIVE 08/15/2022 1347   PROTEINUR 100 (A) 08/15/2022 1347   NITRITE NEGATIVE 08/15/2022 1347   LEUKOCYTESUR NEGATIVE 08/15/2022 1347   Sepsis Labs Recent Labs  Lab 08/17/22 0804 08/18/22 0981  08/19/22 0914 08/20/22 0757  WBC 16.6* 12.0* 9.8 6.6   Microbiology Recent Results (from the past 240 hour(s))  Blood culture (routine x 2)     Status: None   Collection Time: 08/15/22  1:46 PM   Specimen: BLOOD  Result Value Ref Range Status   Specimen Description BLOOD BLOOD LEFT ARM  Final   Special Requests   Final    BOTTLES DRAWN AEROBIC AND ANAEROBIC Blood Culture adequate volume   Culture   Final    NO GROWTH 5 DAYS Performed at [facility], [address], [place], [place] [address]    Report Status 08/20/2022 FINAL  Final  Blood culture (routine x 2)     Status: None   Collection Time: 08/15/22  1:46 PM   Specimen: BLOOD  Result Value Ref Range Status   Specimen Description BLOOD BLOOD RIGHT ARM  Final   Special Requests   Final    BOTTLES DRAWN AEROBIC AND ANAEROBIC Blood Culture adequate volume   Culture   Final    NO GROWTH 5 DAYS Performed at [facility], [address], [place], [place] [address]    Report Status 08/20/2022 FINAL  Final     Time coordinating discharge: Over 30 minutes  SIGNED:   Tresa Moore, MD  [facility] 08/20/2022, 2:29 PM Pager   If 7PM-7AM, please contact night-coverage

## 2022-08-20 NOTE — TOC Transition Note (Signed)
Transition of Care [facility]) - CM/SW Discharge Note   Patient Details  Name: Derek Henderson MRN: [id number] Date of Birth: 01-02-1946  Transition of Care [facility]) CM/SW Contact:  Truddie Hidden, RN Phone Number: 08/20/2022, 2:07 PM   Clinical Narrative:    Spoke with patient regarding need for home oxygen use. He was advised the referral for oxygen had been sent to [employer]. Patient advised his portable tank would be delivered to the room and the remaining set up at home today via [employer].  Referral sent to [facility] from [employer].  TOC signing off.          Patient Goals and CMS Choice      Discharge Placement                         Discharge Plan and Services Additional resources added to the After Visit Summary for                                       Social Determinants of Health (SDOH) Interventions SDOH Screenings   Tobacco Use: High Risk (08/15/2022)     Readmission Risk Interventions    01/16/2021    2:09 PM  Readmission Risk Prevention Plan  Post Dischage Appt Complete  Medication Screening Complete  Transportation Screening Complete

## 2022-08-20 NOTE — Progress Notes (Addendum)
SATURATION QUALIFICATIONS: (This note is used to comply with regulatory documentation for home oxygen)  Patient Saturations on Room Air at Rest = 86%  Patient Saturations on 3 Liters of oxygen while Ambulating = 91%  Please briefly explain why patient needs home oxygen: COPD   I agree with above findings.  Lolita Patella MD

## 2022-08-23 DIAGNOSIS — J431 Panlobular emphysema: Secondary | ICD-10-CM | POA: Diagnosis not present

## 2022-08-23 DIAGNOSIS — I739 Peripheral vascular disease, unspecified: Secondary | ICD-10-CM | POA: Diagnosis not present

## 2022-08-23 DIAGNOSIS — S065X9A Traumatic subdural hemorrhage with loss of consciousness of unspecified duration, initial encounter: Secondary | ICD-10-CM | POA: Diagnosis not present

## 2022-08-27 DIAGNOSIS — C641 Malignant neoplasm of right kidney, except renal pelvis: Secondary | ICD-10-CM | POA: Diagnosis not present

## 2022-08-27 DIAGNOSIS — J189 Pneumonia, unspecified organism: Secondary | ICD-10-CM | POA: Diagnosis not present

## 2022-08-27 DIAGNOSIS — J449 Chronic obstructive pulmonary disease, unspecified: Secondary | ICD-10-CM | POA: Diagnosis not present

## 2022-08-27 DIAGNOSIS — R918 Other nonspecific abnormal finding of lung field: Secondary | ICD-10-CM | POA: Diagnosis not present

## 2022-09-10 ENCOUNTER — Ambulatory Visit: Payer: Medicare HMO | Admitting: Urology

## 2022-09-10 DIAGNOSIS — J189 Pneumonia, unspecified organism: Secondary | ICD-10-CM | POA: Diagnosis not present

## 2022-09-10 DIAGNOSIS — J439 Emphysema, unspecified: Secondary | ICD-10-CM | POA: Diagnosis not present

## 2022-09-10 DIAGNOSIS — J449 Chronic obstructive pulmonary disease, unspecified: Secondary | ICD-10-CM | POA: Diagnosis not present

## 2022-09-10 DIAGNOSIS — J90 Pleural effusion, not elsewhere classified: Secondary | ICD-10-CM | POA: Diagnosis not present

## 2022-09-11 ENCOUNTER — Ambulatory Visit: Payer: Medicare HMO | Admitting: Urology

## 2022-09-11 VITALS — BP 133/73 | HR 84 | Ht 68.0 in | Wt 140.2 lb

## 2022-09-11 DIAGNOSIS — N2889 Other specified disorders of kidney and ureter: Secondary | ICD-10-CM | POA: Diagnosis not present
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
## 2022-09-17 ENCOUNTER — Other Ambulatory Visit: Payer: Self-pay | Admitting: Internal Medicine

## 2022-09-17 DIAGNOSIS — R9389 Abnormal findings on diagnostic imaging of other specified body structures: Secondary | ICD-10-CM

## 2022-09-17 DIAGNOSIS — R918 Other nonspecific abnormal finding of lung field: Secondary | ICD-10-CM

## 2022-09-19 ENCOUNTER — Ambulatory Visit
Admission: RE | Admit: 2022-09-19 | Discharge: 2022-09-19 | Disposition: A | Discharge status: Home / Self Care | Payer: Medicare HMO | Source: Ambulatory Visit | Attending: Internal Medicine | Admitting: Internal Medicine

## 2022-09-19 DIAGNOSIS — R9389 Abnormal findings on diagnostic imaging of other specified body structures: Secondary | ICD-10-CM | POA: Insufficient documentation

## 2022-09-19 DIAGNOSIS — R918 Other nonspecific abnormal finding of lung field: Secondary | ICD-10-CM | POA: Diagnosis not present

## 2022-09-19 DIAGNOSIS — J439 Emphysema, unspecified: Secondary | ICD-10-CM | POA: Diagnosis not present

## 2022-09-19 DIAGNOSIS — J90 Pleural effusion, not elsewhere classified: Secondary | ICD-10-CM | POA: Diagnosis not present

## 2022-09-19 MED ORDER — IOHEXOL 300 MG/ML  SOLN
75.0000 mL | Freq: Once | INTRAMUSCULAR | Status: AC | PRN
  Administered 2022-09-19: 75 mL via INTRAVENOUS

## 2022-09-24 DIAGNOSIS — J449 Chronic obstructive pulmonary disease, unspecified: Secondary | ICD-10-CM | POA: Diagnosis not present

## 2022-09-24 DIAGNOSIS — R911 Solitary pulmonary nodule: Secondary | ICD-10-CM | POA: Diagnosis not present

## 2022-09-24 DIAGNOSIS — R918 Other nonspecific abnormal finding of lung field: Secondary | ICD-10-CM | POA: Diagnosis not present

## 2022-12-02 ENCOUNTER — Other Ambulatory Visit (INDEPENDENT_AMBULATORY_CARE_PROVIDER_SITE_OTHER): Payer: Self-pay | Admitting: Nurse Practitioner

## 2022-12-02 DIAGNOSIS — Z9889 Other specified postprocedural states: Secondary | ICD-10-CM

## 2022-12-03 ENCOUNTER — Encounter (INDEPENDENT_AMBULATORY_CARE_PROVIDER_SITE_OTHER): Payer: Medicare HMO

## 2022-12-03 ENCOUNTER — Ambulatory Visit (INDEPENDENT_AMBULATORY_CARE_PROVIDER_SITE_OTHER): Payer: Medicare HMO | Admitting: Nurse Practitioner

## 2022-12-03 DIAGNOSIS — C641 Malignant neoplasm of right kidney, except renal pelvis: Secondary | ICD-10-CM | POA: Diagnosis not present

## 2022-12-10 DIAGNOSIS — J449 Chronic obstructive pulmonary disease, unspecified: Secondary | ICD-10-CM | POA: Diagnosis not present

## 2022-12-10 DIAGNOSIS — R911 Solitary pulmonary nodule: Secondary | ICD-10-CM | POA: Diagnosis not present

## 2022-12-10 DIAGNOSIS — Z125 Encounter for screening for malignant neoplasm of prostate: Secondary | ICD-10-CM | POA: Diagnosis not present

## 2022-12-12 ENCOUNTER — Other Ambulatory Visit: Payer: Self-pay | Admitting: Internal Medicine

## 2022-12-12 DIAGNOSIS — R918 Other nonspecific abnormal finding of lung field: Secondary | ICD-10-CM

## 2022-12-25 ENCOUNTER — Ambulatory Visit: Admission: RE | Admit: 2022-12-25 | Payer: Medicare HMO | Source: Ambulatory Visit

## 2023-01-03 ENCOUNTER — Ambulatory Visit
Admission: RE | Admit: 2023-01-03 | Discharge: 2023-01-03 | Disposition: A | Discharge status: Home / Self Care | Payer: Medicare HMO | Source: Ambulatory Visit | Attending: Internal Medicine | Admitting: Internal Medicine

## 2023-01-03 DIAGNOSIS — R918 Other nonspecific abnormal finding of lung field: Secondary | ICD-10-CM | POA: Diagnosis not present

## 2023-01-03 DIAGNOSIS — R911 Solitary pulmonary nodule: Secondary | ICD-10-CM | POA: Diagnosis not present

## 2023-01-03 DIAGNOSIS — J90 Pleural effusion, not elsewhere classified: Secondary | ICD-10-CM | POA: Diagnosis not present

## 2023-01-03 MED ORDER — IOHEXOL 300 MG/ML  SOLN
75.0000 mL | Freq: Once | INTRAMUSCULAR | Status: AC | PRN
  Administered 2023-01-03: 75 mL via INTRAVENOUS

## 2023-01-29 DIAGNOSIS — F17218 Nicotine dependence, cigarettes, with other nicotine-induced disorders: Secondary | ICD-10-CM | POA: Diagnosis not present

## 2023-01-29 DIAGNOSIS — C3411 Malignant neoplasm of upper lobe, right bronchus or lung: Secondary | ICD-10-CM | POA: Diagnosis not present

## 2023-02-20 ENCOUNTER — Ambulatory Visit
Admission: RE | Admit: 2023-02-20 | Discharge: 2023-02-20 | Disposition: A | Discharge status: Home / Self Care | Payer: Medicare HMO | Source: Ambulatory Visit | Attending: Radiation Oncology | Admitting: Radiation Oncology

## 2023-02-20 ENCOUNTER — Other Ambulatory Visit: Payer: Self-pay | Admitting: *Deleted

## 2023-02-20 ENCOUNTER — Encounter: Payer: Self-pay | Admitting: Radiation Oncology

## 2023-02-20 VITALS — BP 134/82 | HR 97 | Temp 97.5°F | Resp 16 | Ht 73.0 in | Wt 144.0 lb

## 2023-02-20 DIAGNOSIS — C3490 Malignant neoplasm of unspecified part of unspecified bronchus or lung: Secondary | ICD-10-CM

## 2023-02-20 DIAGNOSIS — R918 Other nonspecific abnormal finding of lung field: Secondary | ICD-10-CM | POA: Diagnosis not present

## 2023-02-20 DIAGNOSIS — Z87891 Personal history of nicotine dependence: Secondary | ICD-10-CM | POA: Diagnosis not present

## 2023-02-20 DIAGNOSIS — C649 Malignant neoplasm of unspecified kidney, except renal pelvis: Secondary | ICD-10-CM | POA: Insufficient documentation

## 2023-02-20 DIAGNOSIS — C3411 Malignant neoplasm of upper lobe, right bronchus or lung: Secondary | ICD-10-CM | POA: Diagnosis not present

## 2023-02-20 NOTE — Consult Note (Signed)
NEW PATIENT EVALUATION  Name: Derek Henderson  MRN: [id number]  Date:   02/20/2023     DOB: 03-24-1946   This 77 y.o. male patient presents to the clinic for initial evaluation of right upper lobe lung nodule progressing over time and patient with known renal cell carcinoma.  REFERRING PHYSICIAN: Danella Penton, MD  CHIEF COMPLAINT:  Chief Complaint  Patient presents with   pulmonary nodules    DIAGNOSIS: The encounter diagnosis was Pulmonary nodules.   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  That lesion has remained stable over time and he is a poor surgical candidate so active surveillance is his being performed.  He recently presented with an enlarging right upper lobe pulmonary nodule now 1.1 cm compared to 7 mm 3 months prior.  This is concerning for primary lung cancer.  He is asymptomatic from a pulmonary standpoint specifically [place] cough hemoptysis or chest tightness he has no bone pain.  PLANNED TREATMENT REGIMEN: Possible SBRT  PAST MEDICAL HISTORY:  has a past medical history of Cancer (HCC), COPD (chronic obstructive pulmonary disease) (HCC), Emphysema lung (HCC), History of malignant melanoma, Mild obstructive sleep apnea, PAD (peripheral artery disease) (HCC), Panlobular emphysema (HCC), and Tobacco abuse.    PAST SURGICAL HISTORY:  Past Surgical History:  Procedure Laterality Date   HERNIA REPAIR Left    LOWER EXTREMITY ANGIOGRAPHY Left 02/11/2017   Procedure: Lower Extremity Angiography;  Surgeon: Renford Dills, MD;  Location: [facility];  Service: Cardiovascular;  Laterality: Left;   LOWER EXTREMITY ANGIOGRAPHY Right 02/26/2021   Procedure: LOWER EXTREMITY ANGIOGRAPHY;  Surgeon: Annice Needy, MD;  Location: [facility];  Service:  Cardiovascular;  Laterality: Right;   LOWER EXTREMITY INTERVENTION  02/11/2017   Procedure: LOWER EXTREMITY INTERVENTION;  Surgeon: Renford Dills, MD;  Location: [facility];  Service: Cardiovascular;;   MELANOMA EXCISION Right 2010    FAMILY HISTORY: family history includes Cancer in his brother, brother, father, mother, sister, and sister; Tuberculosis in his mother.  SOCIAL HISTORY:  reports that he quit smoking about 2 years ago. His smoking use included cigarettes. He started smoking about 47 years ago. He has a 45 pack-year smoking history. His smokeless tobacco use includes chew. He reports current alcohol use. He reports that he does not use drugs.  ALLERGIES: Codeine  MEDICATIONS:  Current Outpatient Medications  Medication Sig Dispense Refill   acetaminophen (TYLENOL) 325 MG tablet Take 2 tablets (650 mg total) by mouth every 6 (six) hours as needed for mild pain.     albuterol (VENTOLIN HFA) 108 (90 Base) MCG/ACT inhaler Inhale 1-2 puffs into the lungs every 6 (six) hours as needed for shortness of breath (use when you have trouble breathing.). 1 g 3   albuterol (VENTOLIN HFA) 108 (90 Base) MCG/ACT inhaler Inhale 2 puffs into the lungs every 6 (six) hours as needed for wheezing or shortness of breath. 8 g 0   aspirin EC 81 MG tablet Take 1 tablet (81 mg total) by mouth daily. 150 tablet 2   Cholecalciferol (VITAMIN D3 PO) Take 1 tablet by mouth daily.     clopidogrel (PLAVIX) 75 MG tablet Take 1 tablet (75 mg total) by mouth daily. 90 tablet 3   omeprazole (PRILOSEC) 40 MG capsule Take  40 mg by mouth daily.     No current facility-administered medications for this encounter.    ECOG PERFORMANCE STATUS:  0 - Asymptomatic  REVIEW OF SYSTEMS: Patient denies any weight loss, fatigue, weakness, fever, chills or night sweats. Patient denies any loss of vision, blurred vision. Patient denies any ringing  of the ears or hearing loss. No irregular heartbeat. Patient  denies heart murmur or history of fainting. Patient denies any chest pain or pain radiating to her upper extremities. Patient denies any shortness of breath, difficulty breathing at night, cough or hemoptysis. Patient denies any swelling in the lower legs. Patient denies any nausea vomiting, vomiting of blood, or coffee ground material in the vomitus. Patient denies any stomach pain. Patient states has had normal bowel movements no significant constipation or diarrhea. Patient denies any dysuria, hematuria or significant nocturia. Patient denies any problems walking, swelling in the joints or loss of balance. Patient denies any skin changes, loss of hair or loss of weight. Patient denies any excessive worrying or anxiety or significant depression. Patient denies any problems with insomnia. Patient denies excessive thirst, polyuria, polydipsia. Patient denies any swollen glands, patient denies easy bruising or easy bleeding. Patient denies any recent infections, allergies or URI. Patient "s visual fields have not changed significantly in recent time.   PHYSICAL EXAM: BP 134/82   Pulse 97   Temp (!) 97.5 F (36.4 C) (Tympanic)   Resp 16   Ht 6\' 1"  (1.854 m)   Wt 144 lb (65.3 kg)   BMI 19.00 kg/m  Well-developed well-nourished patient in NAD. HEENT reveals PERLA, EOMI, discs not visualized.  Oral cavity is clear. No oral mucosal lesions are identified. Neck is clear without evidence of cervical or supraclavicular adenopathy. Lungs are clear to A&P. Cardiac examination is essentially unremarkable with regular rate and rhythm without murmur rub or thrill. Abdomen is benign with no organomegaly or masses noted. Motor sensory and DTR levels are equal and symmetric in the upper and lower extremities. Cranial nerves II through XII are grossly intact. Proprioception is intact. No peripheral adenopathy or edema is identified. No motor or sensory levels are noted. Crude visual fields are within normal  range.  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  Risks and benefits of treatment occluding extremely low side effect profile possible development of cough fatigue all were reviewed in detail with the patient and his wife.  I will see him back in follow-up about a week after his PET scan to proceed with simulation for SBRT should that be PET positive.  He will have continued surveillance of his renal cell carcinoma by urology.  Patient wife both comprehend the treatment plan well.  I would like to take this opportunity to thank you for allowing me to participate in the care of your patient.Carmina Miller, MD

## 2023-02-25 ENCOUNTER — Ambulatory Visit
Admission: RE | Admit: 2023-02-25 | Discharge: 2023-02-25 | Disposition: A | Discharge status: Home / Self Care | Payer: Medicare HMO | Source: Ambulatory Visit | Attending: Radiation Oncology | Admitting: Radiation Oncology

## 2023-02-25 DIAGNOSIS — R911 Solitary pulmonary nodule: Secondary | ICD-10-CM | POA: Diagnosis not present

## 2023-02-25 DIAGNOSIS — I700 Atherosclerosis of aorta: Secondary | ICD-10-CM | POA: Insufficient documentation

## 2023-02-25 DIAGNOSIS — C3490 Malignant neoplasm of unspecified part of unspecified bronchus or lung: Secondary | ICD-10-CM | POA: Diagnosis not present

## 2023-02-25 DIAGNOSIS — C641 Malignant neoplasm of right kidney, except renal pelvis: Secondary | ICD-10-CM | POA: Diagnosis not present

## 2023-02-25 DIAGNOSIS — I2510 Atherosclerotic heart disease of native coronary artery without angina pectoris: Secondary | ICD-10-CM | POA: Insufficient documentation

## 2023-02-25 DIAGNOSIS — J439 Emphysema, unspecified: Secondary | ICD-10-CM | POA: Insufficient documentation

## 2023-02-25 LAB — GLUCOSE, CAPILLARY: Glucose-Capillary: 95 mg/dL (ref 70–99)

## 2023-02-25 MED ORDER — FLUDEOXYGLUCOSE F - 18 (FDG) INJECTION
7.5000 | Freq: Once | INTRAVENOUS | Status: AC | PRN
  Administered 2023-02-25: 8.06 via INTRAVENOUS

## 2023-03-05 ENCOUNTER — Encounter: Payer: Self-pay | Admitting: Radiation Oncology

## 2023-03-05 ENCOUNTER — Ambulatory Visit: Payer: Medicare HMO | Admitting: Radiation Oncology

## 2023-03-05 ENCOUNTER — Ambulatory Visit
Admission: RE | Admit: 2023-03-05 | Discharge: 2023-03-05 | Disposition: A | Discharge status: Home / Self Care | Payer: Medicare HMO | Source: Ambulatory Visit | Attending: Radiation Oncology | Admitting: Radiation Oncology

## 2023-03-05 VITALS — BP 161/80 | HR 71 | Temp 98.0°F | Resp 16 | Ht 73.0 in | Wt 144.3 lb

## 2023-03-05 DIAGNOSIS — C3411 Malignant neoplasm of upper lobe, right bronchus or lung: Secondary | ICD-10-CM | POA: Diagnosis not present

## 2023-03-05 DIAGNOSIS — C3490 Malignant neoplasm of unspecified part of unspecified bronchus or lung: Secondary | ICD-10-CM

## 2023-03-05 DIAGNOSIS — C649 Malignant neoplasm of unspecified kidney, except renal pelvis: Secondary | ICD-10-CM | POA: Diagnosis not present

## 2023-03-05 DIAGNOSIS — R918 Other nonspecific abnormal finding of lung field: Secondary | ICD-10-CM | POA: Diagnosis not present

## 2023-03-05 DIAGNOSIS — Z87891 Personal history of nicotine dependence: Secondary | ICD-10-CM | POA: Diagnosis not present

## 2023-03-05 NOTE — Progress Notes (Signed)
Radiation Oncology Follow up Note  Name: Derek Henderson   Date:   03/05/2023 MRN:  [id number] DOB: Mar 09, 1946    This 77 y.o. male presents to the clinic today for reevaluation after PET CT scan tracking a right upper lobe pulmonary nodule in patient with known renal cell carcinoma.  REFERRING PROVIDER: Danella Penton, MD  HPI: Patient is a 77 year old male initially consulted back in early November.  He has a known renal cell carcinoma 4.5 cm in greatest dimension has been stable over time and is under active surveillance based on his poor overall performance status.  He recently presented with an enlarging right upper lobe pulmonary nodule now 1.1 cm compared to 7 mm 3 months prior.  Characteristics are suggestive of bronchogenic carcinoma.  I ran a PET CT scan which shows hypermetabolic activity consistent with bronchogenic carcinoma in this lesion..  Patient also has an enlarged left external iliac lymph node which is also tracer avid on PET scan.  Patient is doing well specifically denies cough hemoptysis or chest tightness I believe pulmonology is scheduling pulmonary function tests on this patient.  COMPLICATIONS OF TREATMENT: none  FOLLOW UP COMPLIANCE: keeps appointments   PHYSICAL EXAM:  BP (!) 161/80   Pulse 71   Temp 98 F (36.7 C)   Resp 16   Ht 6\' 1"  (1.854 m)   Wt 144 lb 4.8 oz (65.5 kg)   BMI 19.04 kg/m  Well-developed well-nourished patient in NAD. HEENT reveals PERLA, EOMI, discs not visualized.  Oral cavity is clear. No oral mucosal lesions are identified. Neck is clear without evidence of cervical or supraclavicular adenopathy. Lungs are clear to A&P. Cardiac examination is essentially unremarkable with regular rate and rhythm without murmur rub or thrill. Abdomen is benign with no organomegaly or masses noted. Motor sensory and DTR levels are equal and symmetric in the upper and lower extremities. Cranial nerves II through XII are grossly intact. Proprioception is  intact. No peripheral adenopathy or edema is identified. No motor or sensory levels are noted. Crude visual fields are within normal range.  RADIOLOGY RESULTS: PET scan reviewed compatible with above-stated findings  PLAN: Present time like to head with SBRT treatment to the right upper lobe lesion.  Would plan on delivering 60 Gray in 5 fractions.  Risks and benefits of treatment including extremely low side effect profile were discussed with the patient and his wife.  This will not affect his pulmonary status so I will proceed with treatment planning after the Thanksgiving holiday.  Will monitor the iliac lesion which is hypermetabolic and decide on possible biopsy in the future should that become enlarged.  Again he is under active surveillance for his renal cell carcinoma.  Patient wife both comprehend my recommendations well.  I personally set up and ordered CT simulation after the holidays.  I would like to take this opportunity to thank you for allowing me to participate in the care of your patient.Carmina Miller, MD

## 2023-03-12 ENCOUNTER — Other Ambulatory Visit: Payer: Self-pay | Admitting: Radiation Oncology

## 2023-03-12 DIAGNOSIS — C801 Malignant (primary) neoplasm, unspecified: Secondary | ICD-10-CM

## 2023-03-17 DIAGNOSIS — Z87891 Personal history of nicotine dependence: Secondary | ICD-10-CM | POA: Diagnosis not present

## 2023-03-17 DIAGNOSIS — C3411 Malignant neoplasm of upper lobe, right bronchus or lung: Secondary | ICD-10-CM | POA: Diagnosis not present

## 2023-03-20 ENCOUNTER — Ambulatory Visit
Admission: RE | Admit: 2023-03-20 | Discharge: 2023-03-20 | Disposition: A | Discharge status: Home / Self Care | Payer: Medicare HMO | Source: Ambulatory Visit | Attending: Radiation Oncology | Admitting: Radiation Oncology

## 2023-03-20 DIAGNOSIS — Z510 Encounter for antineoplastic radiation therapy: Secondary | ICD-10-CM | POA: Diagnosis not present

## 2023-03-20 DIAGNOSIS — R918 Other nonspecific abnormal finding of lung field: Secondary | ICD-10-CM | POA: Insufficient documentation

## 2023-03-20 DIAGNOSIS — C801 Malignant (primary) neoplasm, unspecified: Secondary | ICD-10-CM

## 2023-03-20 DIAGNOSIS — Z85528 Personal history of other malignant neoplasm of kidney: Secondary | ICD-10-CM | POA: Diagnosis not present

## 2023-03-20 DIAGNOSIS — C3411 Malignant neoplasm of upper lobe, right bronchus or lung: Secondary | ICD-10-CM | POA: Insufficient documentation

## 2023-03-20 DIAGNOSIS — Z87891 Personal history of nicotine dependence: Secondary | ICD-10-CM | POA: Diagnosis not present

## 2023-03-20 DIAGNOSIS — C649 Malignant neoplasm of unspecified kidney, except renal pelvis: Secondary | ICD-10-CM | POA: Insufficient documentation

## 2023-03-27 DIAGNOSIS — Z87891 Personal history of nicotine dependence: Secondary | ICD-10-CM | POA: Diagnosis not present

## 2023-03-27 DIAGNOSIS — C3411 Malignant neoplasm of upper lobe, right bronchus or lung: Secondary | ICD-10-CM | POA: Diagnosis not present

## 2023-03-27 DIAGNOSIS — Z85528 Personal history of other malignant neoplasm of kidney: Secondary | ICD-10-CM | POA: Diagnosis not present

## 2023-03-27 DIAGNOSIS — Z510 Encounter for antineoplastic radiation therapy: Secondary | ICD-10-CM | POA: Diagnosis not present

## 2023-03-31 ENCOUNTER — Ambulatory Visit
Admission: RE | Admit: 2023-03-31 | Discharge: 2023-03-31 | Disposition: A | Discharge status: Home / Self Care | Payer: Medicare HMO | Source: Ambulatory Visit | Attending: Radiation Oncology | Admitting: Radiation Oncology

## 2023-03-31 ENCOUNTER — Other Ambulatory Visit: Payer: Self-pay

## 2023-03-31 DIAGNOSIS — Z85528 Personal history of other malignant neoplasm of kidney: Secondary | ICD-10-CM | POA: Diagnosis not present

## 2023-03-31 DIAGNOSIS — Z510 Encounter for antineoplastic radiation therapy: Secondary | ICD-10-CM | POA: Diagnosis not present

## 2023-03-31 DIAGNOSIS — Z87891 Personal history of nicotine dependence: Secondary | ICD-10-CM | POA: Diagnosis not present

## 2023-03-31 DIAGNOSIS — C3411 Malignant neoplasm of upper lobe, right bronchus or lung: Secondary | ICD-10-CM | POA: Diagnosis not present

## 2023-03-31 LAB — RAD ONC ARIA SESSION SUMMARY
Course Elapsed Days: 0
Plan Fractions Treated to Date: 1
Plan Prescribed Dose Per Fraction: 12 Gy
Plan Total Fractions Prescribed: 5
Plan Total Prescribed Dose: 60 Gy
Reference Point Dosage Given to Date: 12 Gy
Reference Point Session Dosage Given: 12 Gy
Session Number: 1

## 2023-04-02 ENCOUNTER — Other Ambulatory Visit: Payer: Self-pay

## 2023-04-02 ENCOUNTER — Ambulatory Visit
Admission: RE | Admit: 2023-04-02 | Discharge: 2023-04-02 | Disposition: A | Discharge status: Home / Self Care | Payer: Medicare HMO | Source: Ambulatory Visit | Attending: Radiation Oncology | Admitting: Radiation Oncology

## 2023-04-02 DIAGNOSIS — Z510 Encounter for antineoplastic radiation therapy: Secondary | ICD-10-CM | POA: Diagnosis not present

## 2023-04-02 DIAGNOSIS — C3411 Malignant neoplasm of upper lobe, right bronchus or lung: Secondary | ICD-10-CM | POA: Diagnosis not present

## 2023-04-02 DIAGNOSIS — Z85528 Personal history of other malignant neoplasm of kidney: Secondary | ICD-10-CM | POA: Diagnosis not present

## 2023-04-02 LAB — RAD ONC ARIA SESSION SUMMARY
Course Elapsed Days: 2
Plan Fractions Treated to Date: 2
Plan Prescribed Dose Per Fraction: 12 Gy
Plan Total Fractions Prescribed: 5
Plan Total Prescribed Dose: 60 Gy
Reference Point Dosage Given to Date: 24 Gy
Reference Point Session Dosage Given: 12 Gy
Session Number: 2

## 2023-04-07 ENCOUNTER — Other Ambulatory Visit: Payer: Self-pay

## 2023-04-07 ENCOUNTER — Ambulatory Visit
Admission: RE | Admit: 2023-04-07 | Discharge: 2023-04-07 | Disposition: A | Discharge status: Home / Self Care | Payer: Medicare HMO | Source: Ambulatory Visit | Attending: Radiation Oncology | Admitting: Radiation Oncology

## 2023-04-07 DIAGNOSIS — Z510 Encounter for antineoplastic radiation therapy: Secondary | ICD-10-CM | POA: Diagnosis not present

## 2023-04-07 DIAGNOSIS — C3411 Malignant neoplasm of upper lobe, right bronchus or lung: Secondary | ICD-10-CM | POA: Diagnosis not present

## 2023-04-07 DIAGNOSIS — Z85528 Personal history of other malignant neoplasm of kidney: Secondary | ICD-10-CM | POA: Diagnosis not present

## 2023-04-07 LAB — RAD ONC ARIA SESSION SUMMARY
Course Elapsed Days: 7
Plan Fractions Treated to Date: 3
Plan Prescribed Dose Per Fraction: 12 Gy
Plan Total Fractions Prescribed: 5
Plan Total Prescribed Dose: 60 Gy
Reference Point Dosage Given to Date: 36 Gy
Reference Point Session Dosage Given: 12 Gy
Session Number: 3

## 2023-04-10 ENCOUNTER — Other Ambulatory Visit: Payer: Self-pay

## 2023-04-10 ENCOUNTER — Ambulatory Visit
Admission: RE | Admit: 2023-04-10 | Discharge: 2023-04-10 | Disposition: A | Discharge status: Home / Self Care | Payer: Medicare HMO | Source: Ambulatory Visit | Attending: Radiation Oncology | Admitting: Radiation Oncology

## 2023-04-10 DIAGNOSIS — Z85528 Personal history of other malignant neoplasm of kidney: Secondary | ICD-10-CM | POA: Diagnosis not present

## 2023-04-10 DIAGNOSIS — Z510 Encounter for antineoplastic radiation therapy: Secondary | ICD-10-CM | POA: Diagnosis not present

## 2023-04-10 DIAGNOSIS — C3411 Malignant neoplasm of upper lobe, right bronchus or lung: Secondary | ICD-10-CM | POA: Diagnosis not present

## 2023-04-10 LAB — RAD ONC ARIA SESSION SUMMARY
Course Elapsed Days: 10
Plan Fractions Treated to Date: 4
Plan Prescribed Dose Per Fraction: 12 Gy
Plan Total Fractions Prescribed: 5
Plan Total Prescribed Dose: 60 Gy
Reference Point Dosage Given to Date: 48 Gy
Reference Point Session Dosage Given: 12 Gy
Session Number: 4

## 2023-04-14 ENCOUNTER — Ambulatory Visit
Admission: RE | Admit: 2023-04-14 | Discharge: 2023-04-14 | Disposition: A | Discharge status: Home / Self Care | Payer: Medicare HMO | Source: Ambulatory Visit | Attending: Radiation Oncology | Admitting: Radiation Oncology

## 2023-04-14 ENCOUNTER — Other Ambulatory Visit: Payer: Self-pay

## 2023-04-14 ENCOUNTER — Other Ambulatory Visit (INDEPENDENT_AMBULATORY_CARE_PROVIDER_SITE_OTHER): Payer: Self-pay | Admitting: Nurse Practitioner

## 2023-04-14 DIAGNOSIS — Z510 Encounter for antineoplastic radiation therapy: Secondary | ICD-10-CM | POA: Diagnosis not present

## 2023-04-14 DIAGNOSIS — C3411 Malignant neoplasm of upper lobe, right bronchus or lung: Secondary | ICD-10-CM | POA: Diagnosis not present

## 2023-04-14 DIAGNOSIS — Z85528 Personal history of other malignant neoplasm of kidney: Secondary | ICD-10-CM | POA: Diagnosis not present

## 2023-04-14 LAB — RAD ONC ARIA SESSION SUMMARY
Course Elapsed Days: 14
Plan Fractions Treated to Date: 5
Plan Prescribed Dose Per Fraction: 12 Gy
Plan Total Fractions Prescribed: 5
Plan Total Prescribed Dose: 60 Gy
Reference Point Dosage Given to Date: 60 Gy
Reference Point Session Dosage Given: 12 Gy
Session Number: 5

## 2023-04-14 NOTE — Telephone Encounter (Signed)
Patient needs to call and make an appointment

## 2023-04-15 NOTE — Radiation Completion Notes (Signed)
Patient Name: Derek Henderson, Derek Henderson MRN: [id number] Date of Birth: 10/01/1945 Referring Physician: Bethann Punches, M.D. Date of Service: 2023-04-15 Radiation Oncologist: Carmina Miller, M.D. [facility]                             RADIATION ONCOLOGY END OF TREATMENT NOTE     Diagnosis: C34.11 Malignant neoplasm of upper lobe, right bronchus or lung Intent: Curative     HPI: Patient is a 77 year old male initially consulted back in early November.  He has a known renal cell carcinoma 4.5 cm in greatest dimension has been stable over time and is under active surveillance based on his poor overall performance status.  He recently presented with an enlarging right upper lobe pulmonary nodule now 1.1 cm compared to 7 mm 3 months prior.  Characteristics are suggestive of bronchogenic carcinoma.  I ran a PET CT scan which shows hypermetabolic activity consistent with bronchogenic carcinoma in this lesion..  Patient also has an enlarged left external iliac lymph node which is also tracer avid on PET scan.  Patient is doing well specifically denies cough hemoptysis or chest tightness I believe pulmonology is scheduling pulmonary function tests on this patient.      ==========DELIVERED PLANS==========  First Treatment Date: 2023-03-31 Last Treatment Date: 2023-04-14   Plan Name: Lung_[id number] Site: Lung, Right Technique: SBRT/SRT-IMRT Mode: Photon Dose Per Fraction: 12 Gy Prescribed Dose (Delivered / Prescribed): 60 Gy / 60 Gy Prescribed Fxs (Delivered / Prescribed): 5 / 5     ==========ON TREATMENT VISIT DATES========== 2023-03-31, 2023-04-02, 2023-04-07, 2023-04-07, 2023-04-10, 2023-04-14     ==========UPCOMING VISITS==========       ==========APPENDIX - ON TREATMENT VISIT NOTES==========   See weekly On Treatment Notes in Epic for details in the Media tab (listed as Progress notes on the On Treatment Visit Dates listed above).

## 2023-05-07 DIAGNOSIS — L821 Other seborrheic keratosis: Secondary | ICD-10-CM | POA: Diagnosis not present

## 2023-05-07 DIAGNOSIS — L578 Other skin changes due to chronic exposure to nonionizing radiation: Secondary | ICD-10-CM | POA: Diagnosis not present

## 2023-05-07 DIAGNOSIS — L814 Other melanin hyperpigmentation: Secondary | ICD-10-CM | POA: Diagnosis not present

## 2023-05-07 DIAGNOSIS — D225 Melanocytic nevi of trunk: Secondary | ICD-10-CM | POA: Diagnosis not present

## 2023-05-07 DIAGNOSIS — L570 Actinic keratosis: Secondary | ICD-10-CM | POA: Diagnosis not present

## 2023-05-12 ENCOUNTER — Telehealth: Payer: Self-pay | Admitting: *Deleted

## 2023-05-12 NOTE — Telephone Encounter (Signed)
The wife called over saying that the husband got a phone call but he could not understand it and wanted to have someone else to call back.  I put her on hold and call down to radiation and they said nobody down there sent a message and then he has an appointment coming in on February 3 and I bet it was just 1 of those scheduling 1 steps since the information and then hangs up.  The wife says that is probably it thank you so much

## 2023-05-16 DIAGNOSIS — J188 Other pneumonia, unspecified organism: Secondary | ICD-10-CM | POA: Diagnosis not present

## 2023-05-19 ENCOUNTER — Encounter: Payer: Self-pay | Admitting: Radiation Oncology

## 2023-05-19 ENCOUNTER — Ambulatory Visit
Admission: RE | Admit: 2023-05-19 | Discharge: 2023-05-19 | Disposition: A | Discharge status: Home / Self Care | Payer: Medicare HMO | Source: Ambulatory Visit | Attending: Radiation Oncology | Admitting: Radiation Oncology

## 2023-05-19 VITALS — BP 140/67 | HR 70 | Temp 96.2°F | Resp 18 | Ht 73.0 in | Wt 139.3 lb

## 2023-05-19 DIAGNOSIS — C3490 Malignant neoplasm of unspecified part of unspecified bronchus or lung: Secondary | ICD-10-CM | POA: Insufficient documentation

## 2023-05-19 DIAGNOSIS — J441 Chronic obstructive pulmonary disease with (acute) exacerbation: Secondary | ICD-10-CM | POA: Diagnosis not present

## 2023-05-19 NOTE — Progress Notes (Signed)
Radiation Oncology Follow up Note  Name: Derek Henderson   Date:   05/19/2023 MRN:  [id number] DOB: 04/20/1945    This 78 y.o. male presents to the clinic today for 1 month follow-up status post SBRT treatment to his right upper lobe for primary bronchogenic carcinoma.  REFERRING PROVIDER: Danella Penton, MD  HPI: Patient is a 78 year old male now out 1 month having completed SBRT to his right upper lobe for a 1.1 cm progressive primary bronchogenic carcinoma.  He is seen today in routine follow-up 1 month out is doing well specifically denies any exacerbation of cough hemoptysis chest tightness or any change in his pulmonary status.Marland Kitchen  He is currently under surveillance by Dr. Apolinar Junes for a renal mass which continues to decrease in size.  COMPLICATIONS OF TREATMENT: none  FOLLOW UP COMPLIANCE: keeps appointments   PHYSICAL EXAM:  BP (!) 140/67   Pulse 70   Temp (!) 96.2 F (35.7 C) (Tympanic)   Resp 18   Ht 6\' 1"  (1.854 m)   Wt 139 lb 4.8 oz (63.2 kg)   BMI 18.38 kg/m  Well-developed well-nourished patient in NAD. HEENT reveals PERLA, EOMI, discs not visualized.  Oral cavity is clear. No oral mucosal lesions are identified. Neck is clear without evidence of cervical or supraclavicular adenopathy. Lungs are clear to A&P. Cardiac examination is essentially unremarkable with regular rate and rhythm without murmur rub or thrill. Abdomen is benign with no organomegaly or masses noted. Motor sensory and DTR levels are equal and symmetric in the upper and lower extremities. Cranial nerves II through XII are grossly intact. Proprioception is intact. No peripheral adenopathy or edema is identified. No motor or sensory levels are noted. Crude visual fields are within normal range.  RADIOLOGY RESULTS: No current films for review CT scan of his chest ordered in 3 months  PLAN: At the present time patient is doing well very low side effect profile from his SBRT.  Of asked to see back in 3 months  with a follow-up CT scan of the chest with contrast.  He continues close follow-up care with urology for his presumed renal cell carcinoma.  He has scheduled imaging of that.  Patient knows to call with any concerns.  I would like to take this opportunity to thank you for allowing me to participate in the care of your patient.Carmina Miller, MD

## 2023-05-21 DIAGNOSIS — J441 Chronic obstructive pulmonary disease with (acute) exacerbation: Secondary | ICD-10-CM | POA: Diagnosis not present

## 2023-05-26 DIAGNOSIS — J189 Pneumonia, unspecified organism: Secondary | ICD-10-CM | POA: Diagnosis not present

## 2023-05-26 DIAGNOSIS — J441 Chronic obstructive pulmonary disease with (acute) exacerbation: Secondary | ICD-10-CM | POA: Diagnosis not present

## 2023-06-03 DIAGNOSIS — Z0000 Encounter for general adult medical examination without abnormal findings: Secondary | ICD-10-CM | POA: Diagnosis not present

## 2023-06-03 DIAGNOSIS — Z1331 Encounter for screening for depression: Secondary | ICD-10-CM | POA: Diagnosis not present

## 2023-06-03 DIAGNOSIS — J449 Chronic obstructive pulmonary disease, unspecified: Secondary | ICD-10-CM | POA: Diagnosis not present

## 2023-06-05 ENCOUNTER — Ambulatory Visit: Payer: Medicare HMO

## 2023-06-11 ENCOUNTER — Ambulatory Visit
Admission: RE | Admit: 2023-06-11 | Discharge: 2023-06-11 | Disposition: A | Discharge status: Home / Self Care | Payer: Medicare HMO | Source: Ambulatory Visit | Attending: Urology | Admitting: Urology

## 2023-06-11 ENCOUNTER — Ambulatory Visit: Payer: Self-pay | Admitting: Urology

## 2023-06-11 DIAGNOSIS — N2889 Other specified disorders of kidney and ureter: Secondary | ICD-10-CM | POA: Diagnosis not present

## 2023-06-11 DIAGNOSIS — I723 Aneurysm of iliac artery: Secondary | ICD-10-CM | POA: Diagnosis not present

## 2023-06-11 DIAGNOSIS — N320 Bladder-neck obstruction: Secondary | ICD-10-CM | POA: Diagnosis not present

## 2023-06-11 MED ORDER — IOHEXOL 300 MG/ML  SOLN
100.0000 mL | Freq: Once | INTRAMUSCULAR | Status: AC | PRN
  Administered 2023-06-11: 80 mL via INTRAVENOUS

## 2023-06-12 DIAGNOSIS — Z125 Encounter for screening for malignant neoplasm of prostate: Secondary | ICD-10-CM | POA: Diagnosis not present

## 2023-06-12 DIAGNOSIS — E538 Deficiency of other specified B group vitamins: Secondary | ICD-10-CM | POA: Diagnosis not present

## 2023-06-12 DIAGNOSIS — R739 Hyperglycemia, unspecified: Secondary | ICD-10-CM | POA: Diagnosis not present

## 2023-06-12 DIAGNOSIS — E782 Mixed hyperlipidemia: Secondary | ICD-10-CM | POA: Diagnosis not present

## 2023-06-19 DIAGNOSIS — E785 Hyperlipidemia, unspecified: Secondary | ICD-10-CM | POA: Diagnosis not present

## 2023-06-19 DIAGNOSIS — Z0000 Encounter for general adult medical examination without abnormal findings: Secondary | ICD-10-CM | POA: Diagnosis not present

## 2023-06-19 DIAGNOSIS — J449 Chronic obstructive pulmonary disease, unspecified: Secondary | ICD-10-CM | POA: Diagnosis not present

## 2023-06-19 DIAGNOSIS — I739 Peripheral vascular disease, unspecified: Secondary | ICD-10-CM | POA: Diagnosis not present

## 2023-07-02 ENCOUNTER — Telehealth: Payer: Self-pay | Admitting: *Deleted

## 2023-07-02 NOTE — Telephone Encounter (Signed)
 Pt has appt 08/18/2023 10:30 to get scan at outpatient on [address]. And 08/25/23 9:50 to see radiation MD. He wrote it down

## 2023-07-03 ENCOUNTER — Encounter: Payer: Self-pay | Admitting: Urology

## 2023-07-03 ENCOUNTER — Ambulatory Visit (INDEPENDENT_AMBULATORY_CARE_PROVIDER_SITE_OTHER): Payer: Medicare HMO | Admitting: Urology

## 2023-07-03 VITALS — BP 183/77 | HR 77 | Ht 73.0 in | Wt 137.8 lb

## 2023-07-03 DIAGNOSIS — C641 Malignant neoplasm of right kidney, except renal pelvis: Secondary | ICD-10-CM
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
## 2023-07-29 DIAGNOSIS — Z85118 Personal history of other malignant neoplasm of bronchus and lung: Secondary | ICD-10-CM | POA: Diagnosis not present

## 2023-07-29 DIAGNOSIS — J441 Chronic obstructive pulmonary disease with (acute) exacerbation: Secondary | ICD-10-CM | POA: Diagnosis not present

## 2023-07-29 DIAGNOSIS — L570 Actinic keratosis: Secondary | ICD-10-CM | POA: Diagnosis not present

## 2023-08-18 ENCOUNTER — Ambulatory Visit
Admission: RE | Admit: 2023-08-18 | Discharge: 2023-08-18 | Disposition: A | Discharge status: Home / Self Care | Payer: Medicare HMO | Source: Ambulatory Visit | Attending: Radiation Oncology | Admitting: Radiation Oncology

## 2023-08-18 DIAGNOSIS — J432 Centrilobular emphysema: Secondary | ICD-10-CM | POA: Diagnosis not present

## 2023-08-18 DIAGNOSIS — R591 Generalized enlarged lymph nodes: Secondary | ICD-10-CM | POA: Diagnosis not present

## 2023-08-18 DIAGNOSIS — C3490 Malignant neoplasm of unspecified part of unspecified bronchus or lung: Secondary | ICD-10-CM | POA: Insufficient documentation

## 2023-08-18 DIAGNOSIS — I700 Atherosclerosis of aorta: Secondary | ICD-10-CM | POA: Diagnosis not present

## 2023-08-18 MED ORDER — IOHEXOL 300 MG/ML  SOLN
75.0000 mL | Freq: Once | INTRAMUSCULAR | Status: AC | PRN
  Administered 2023-08-18: 75 mL via INTRAVENOUS

## 2023-08-25 ENCOUNTER — Other Ambulatory Visit: Payer: Self-pay | Admitting: *Deleted

## 2023-08-25 ENCOUNTER — Ambulatory Visit
Admission: RE | Admit: 2023-08-25 | Discharge: 2023-08-25 | Disposition: A | Discharge status: Home / Self Care | Payer: Medicare HMO | Source: Ambulatory Visit | Attending: Radiation Oncology | Admitting: Radiation Oncology

## 2023-08-25 ENCOUNTER — Encounter: Payer: Self-pay | Admitting: Radiation Oncology

## 2023-08-25 VITALS — BP 149/89 | HR 99 | Temp 97.4°F | Resp 16 | Wt 131.0 lb

## 2023-08-25 DIAGNOSIS — C3490 Malignant neoplasm of unspecified part of unspecified bronchus or lung: Secondary | ICD-10-CM

## 2023-08-25 DIAGNOSIS — Z87891 Personal history of nicotine dependence: Secondary | ICD-10-CM | POA: Diagnosis not present

## 2023-08-25 DIAGNOSIS — C3411 Malignant neoplasm of upper lobe, right bronchus or lung: Secondary | ICD-10-CM | POA: Diagnosis not present

## 2023-08-25 NOTE — Progress Notes (Signed)
 Radiation Oncology Follow up Note  Name: Derek Henderson   Date:   08/25/2023 MRN:  [id number] DOB: 08/21/1945    This 78 y.o. male presents to the clinic today for 73-month follow-up status post SBRT treatment to his right upper lobe for a primary bronchogenic carcinoma.  REFERRING PROVIDER: Sari Cunning, MD  HPI: Patient is a 78 year old male now out 4 months having received SBRT to his right upper lobe for primary stage I bronchogenic carcinoma.  Seen today in routine follow-up he is doing well he has got a minor cough although from previous imaging studies he most likely had pneumonia at least last month.  He specifically Nuys dysphagia hemoptysis or change in pulmonary status..  He had a recent CT scan showing interval decrease in the size of the right upper lobe pulmonary nodule consistent with treatment response.  He does have some new mild mediastinal lymphadenopathy with border hilar lymphadenopathy thought to be related to his aspiration or pneumonia.  COMPLICATIONS OF TREATMENT: none  FOLLOW UP COMPLIANCE: keeps appointments   PHYSICAL EXAM:  BP (!) 149/89   Pulse 99   Temp (!) 97.4 F (36.3 C)   Resp 16   Wt 131 lb (59.4 kg)   BMI 17.28 kg/m  Well-developed well-nourished patient in NAD. HEENT reveals PERLA, EOMI, discs not visualized.  Oral cavity is clear. No oral mucosal lesions are identified. Neck is clear without evidence of cervical or supraclavicular adenopathy. Lungs are clear to A&P. Cardiac examination is essentially unremarkable with regular rate and rhythm without murmur rub or thrill. Abdomen is benign with no organomegaly or masses noted. Motor sensory and DTR levels are equal and symmetric in the upper and lower extremities. Cranial nerves II through XII are grossly intact. Proprioception is intact. No peripheral adenopathy or edema is identified. No motor or sensory levels are noted. Crude visual fields are within normal range.  RADIOLOGY RESULTS: CT scans  reviewed compatible with above-stated findings  PLAN: Present time patient is doing well excellent response to therapy to his right upper lobe lesion.  Based on the slight mediastinal adenopathy will repeat his scan in 4 months and see him back in follow-up shortly thereafter.  Patient knows to call with any concerns.  I would like to take this opportunity to thank you for allowing me to participate in the care of your patient.Glenis Langdon, MD

## 2023-12-16 DIAGNOSIS — C3411 Malignant neoplasm of upper lobe, right bronchus or lung: Secondary | ICD-10-CM | POA: Diagnosis not present

## 2023-12-16 DIAGNOSIS — E782 Mixed hyperlipidemia: Secondary | ICD-10-CM | POA: Diagnosis not present

## 2023-12-23 DIAGNOSIS — J449 Chronic obstructive pulmonary disease, unspecified: Secondary | ICD-10-CM | POA: Diagnosis not present

## 2023-12-23 DIAGNOSIS — Z125 Encounter for screening for malignant neoplasm of prostate: Secondary | ICD-10-CM | POA: Diagnosis not present

## 2023-12-23 DIAGNOSIS — E785 Hyperlipidemia, unspecified: Secondary | ICD-10-CM | POA: Diagnosis not present

## 2023-12-23 DIAGNOSIS — Z79899 Other long term (current) drug therapy: Secondary | ICD-10-CM | POA: Diagnosis not present

## 2023-12-26 ENCOUNTER — Ambulatory Visit
Admission: RE | Admit: 2023-12-26 | Discharge: 2023-12-26 | Disposition: A | Discharge status: Home / Self Care | Source: Ambulatory Visit | Attending: Radiation Oncology | Admitting: Radiation Oncology

## 2023-12-26 DIAGNOSIS — C3490 Malignant neoplasm of unspecified part of unspecified bronchus or lung: Secondary | ICD-10-CM | POA: Diagnosis not present

## 2023-12-26 DIAGNOSIS — J90 Pleural effusion, not elsewhere classified: Secondary | ICD-10-CM | POA: Diagnosis not present

## 2023-12-26 DIAGNOSIS — J439 Emphysema, unspecified: Secondary | ICD-10-CM | POA: Diagnosis not present

## 2023-12-26 DIAGNOSIS — I700 Atherosclerosis of aorta: Secondary | ICD-10-CM | POA: Diagnosis not present

## 2023-12-26 MED ORDER — IOHEXOL 300 MG/ML  SOLN
75.0000 mL | Freq: Once | INTRAMUSCULAR | Status: AC | PRN
  Administered 2023-12-26: 75 mL via INTRAVENOUS

## 2024-01-08 ENCOUNTER — Ambulatory Visit: Admitting: Radiation Oncology

## 2024-01-08 ENCOUNTER — Other Ambulatory Visit: Payer: Self-pay | Admitting: *Deleted

## 2024-01-08 ENCOUNTER — Encounter: Payer: Self-pay | Admitting: Radiation Oncology

## 2024-01-08 ENCOUNTER — Inpatient Hospital Stay
Admission: RE | Admit: 2024-01-08 | Discharge: 2024-01-08 | Disposition: A | Discharge status: Home / Self Care | Source: Ambulatory Visit | Attending: Radiation Oncology | Admitting: Radiation Oncology

## 2024-01-08 VITALS — BP 169/75 | HR 55 | Resp 16 | Ht 73.0 in | Wt 138.2 lb

## 2024-01-08 DIAGNOSIS — C3411 Malignant neoplasm of upper lobe, right bronchus or lung: Secondary | ICD-10-CM | POA: Diagnosis not present

## 2024-01-08 DIAGNOSIS — R918 Other nonspecific abnormal finding of lung field: Secondary | ICD-10-CM

## 2024-01-08 DIAGNOSIS — Z87891 Personal history of nicotine dependence: Secondary | ICD-10-CM | POA: Diagnosis not present

## 2024-01-08 NOTE — Progress Notes (Signed)
 Radiation Oncology Follow up Note  Name: Derek Henderson   Date:   01/08/2024 MRN:  [id number] DOB: March 30, 1946    This 78 y.o. male presents to the clinic today for 28-month follow-up status post SBRT to his right upper lobe for primary non-small cell lung cancer.  REFERRING PROVIDER: Cleotilde Oneil FALCON, MD  HPI: Patient is a 78 year old male now out 8 months having completed SBRT to his right upper lobe for a stage I non-small cell lung cancer seen today in routine follow-up he is doing well specifically Nuys cough hemoptysis dysphagia or any change in his pulmonary status.  He had a recent CT scan of his chest.  Which shows stable solid spiculated irregular right upper lobe pulmonary nodule.  In my interpretation this is almost completely resolved.  There is also decrease size of mediastinal hilar nodes from prior examination.  Does have a small left pleural effusion.  Partly visualized is a solid right renal mass measuring 3.8 cm compared to 4.3 on his previous examination cm.  COMPLICATIONS OF TREATMENT: none  FOLLOW UP COMPLIANCE: keeps appointments   PHYSICAL EXAM:  BP (!) 169/75 Comment: Wife in hospital, patient upset  Pulse (!) 55   Resp 16   Ht 6' 1 (1.854 m)   Wt 138 lb 3.2 oz (62.7 kg)   BMI 18.23 kg/m  Well-developed well-nourished patient in NAD. HEENT reveals PERLA, EOMI, discs not visualized.  Oral cavity is clear. No oral mucosal lesions are identified. Neck is clear without evidence of cervical or supraclavicular adenopathy. Lungs are clear to A&P. Cardiac examination is essentially unremarkable with regular rate and rhythm without murmur rub or thrill. Abdomen is benign with no organomegaly or masses noted. Motor sensory and DTR levels are equal and symmetric in the upper and lower extremities. Cranial nerves II through XII are grossly intact. Proprioception is intact. No peripheral adenopathy or edema is identified. No motor or sensory levels are noted. Crude visual fields  are within normal range.  RADIOLOGY RESULTS: CT scans serially reviewed  PLAN: Present time patient is doing well excellent response to SBRT treatment.  He has extremely low side effect profile.  I am pleased with his overall progress.  I have asked to see him back in 6 months for follow-up with a repeat CT scan.  Patient knows to call with any concerns.  I would like to take this opportunity to thank you for allowing me to participate in the care of your patient.SABRA Marcey Penton, MD

## 2024-07-08 ENCOUNTER — Other Ambulatory Visit

## 2024-07-22 ENCOUNTER — Ambulatory Visit: Admitting: Radiation Oncology
